# Patient Record
Sex: Male | Born: 1950 | Race: Asian | Hispanic: No | Marital: Married | Smoking: Never smoker
Health system: Southern US, Community
[De-identification: ages and names within clinical notes are randomized; demographics above are authoritative.]

## PROBLEM LIST (undated history)

## (undated) DIAGNOSIS — E119 Type 2 diabetes mellitus without complications: Secondary | ICD-10-CM

## (undated) HISTORY — DX: Type 2 diabetes mellitus without complications: E11.9

## (undated) HISTORY — PX: SPINE SURGERY: SHX786

---

## 2016-01-11 ENCOUNTER — Ambulatory Visit (INDEPENDENT_AMBULATORY_CARE_PROVIDER_SITE_OTHER): Payer: Medicare Other | Admitting: Family Medicine

## 2016-01-11 VITALS — BP 120/82 | HR 75 | Temp 98.0°F | Resp 16 | Ht 68.0 in | Wt 167.8 lb

## 2016-01-11 DIAGNOSIS — H7291 Unspecified perforation of tympanic membrane, right ear: Secondary | ICD-10-CM | POA: Diagnosis not present

## 2016-01-11 NOTE — Patient Instructions (Addendum)
I am arranging an appointment with Dr. Esaw DaceJim Crossley as soon as possible. You will be getting a telephone call when the appointment has been made.    IF you received an x-ray today, you will receive an invoice from [facility]. Please contact [facility] at [phone] with questions or concerns regarding your invoice.   IF you received labwork today, you will receive an invoice from [facility]. Please contact [facility] at [phone] with questions or concerns regarding your invoice.   Our billing staff will not be able to assist you with questions regarding bills from these companies.  You will be contacted with the lab results as soon as they are available. The fastest way to get your results is to activate your My Chart account. Instructions are located on the last page of this paperwork. If you have not heard from us regarding the results in 2 weeks, please contact this office.

## 2016-01-11 NOTE — Progress Notes (Signed)
This is a 65 year old Asian man who was swimming 6 weeks ago in [place] when he was visiting his son. The next day he took an airplane ride and noticed that he had pain and loss of hearing in his right ear. He was seen by another office and amoxicillin was prescribed. He has not had any improvement.  When the patient bears down and lows through his nose, he can hear air whishing through the right ear.  Objective:BP 120/82 mmHg  Pulse 75  Temp(Src) 98 F (36.7 C) (Oral)  Resp 16  Ht 5\' 8"  (1.727 m)  Wt 167 lb 12.8 oz (76.114 kg)  BMI 25.52 kg/m2  SpO2 99% Very pleasant gentleman in no acute distress Audible air with sling through right ear when patient blows to his nose Examination tympanic membranes reveals marked retraction of the left and a perforation of the right eardrum.  Assessment: Persistent perforation.  Plan: ENT evaluation as soon as we can get him in  Signed, Sheila OatsKurt Lisabeth Mian M.D.

## 2016-07-03 ENCOUNTER — Ambulatory Visit (INDEPENDENT_AMBULATORY_CARE_PROVIDER_SITE_OTHER): Payer: Medicare Other

## 2016-07-03 DIAGNOSIS — Z23 Encounter for immunization: Secondary | ICD-10-CM | POA: Diagnosis not present

## 2016-08-22 ENCOUNTER — Ambulatory Visit (INDEPENDENT_AMBULATORY_CARE_PROVIDER_SITE_OTHER): Payer: Medicare HMO | Admitting: Family Medicine

## 2016-08-22 VITALS — BP 150/82 | HR 91 | Temp 98.1°F | Resp 18 | Ht 67.0 in | Wt 171.0 lb

## 2016-08-22 DIAGNOSIS — E785 Hyperlipidemia, unspecified: Secondary | ICD-10-CM | POA: Diagnosis not present

## 2016-08-22 DIAGNOSIS — I10 Essential (primary) hypertension: Secondary | ICD-10-CM

## 2016-08-22 DIAGNOSIS — E119 Type 2 diabetes mellitus without complications: Secondary | ICD-10-CM

## 2016-08-22 MED ORDER — ROSUVASTATIN CALCIUM 40 MG PO TABS: 40.0000 mg | ORAL_TABLET | Freq: Every day | ORAL | 1 refills | Status: DC

## 2016-08-22 MED ORDER — LISINOPRIL-HYDROCHLOROTHIAZIDE 20-12.5 MG PO TABS: 1.0000 | ORAL_TABLET | Freq: Every day | ORAL | 1 refills | Status: DC

## 2016-08-22 MED ORDER — METFORMIN HCL 500 MG PO TABS: 500.0000 mg | ORAL_TABLET | Freq: Two times a day (BID) | ORAL | 1 refills | Status: DC

## 2016-08-22 NOTE — Progress Notes (Signed)
Subjective:  By signing my name below, I, Lee Lawson, attest that this documentation has been prepared under the direction and in the presence of Lee SorensonEva Clifford Coudriet, MD Electronically Signed: Charline BillsEssence Lawson, ED Scribe 08/22/2016 at 12:38 PM.   Patient ID: Lee Lawson Travaglini, male    DOB: June 22, 1951, 66 y.o.   MRN: [id number]  Chief Complaint  Patient presents with  . Medication Refill    Lisinopril/HCTZ; Crestor; Metformin.   HPI Lee Lawson Bott is a 66 y.o. male who presents to [facility] at [facility] for a medication refill of Lisinopril-HCTZ, Crestor and Metformin. Pt is not fasting at this visit. He checks his blood glucose twice weekly in the mornings with readings of 100-105, however this morning his blood glucose was 97. He occasionally checks his BP at home in the mornings with average systolic readings in the 140s. Pt denies nausea, diarrhea, abdominal pain, burning sensation in feet, visual disturbances, rash, wounds. Last eye exam was ~1 year ago. He does not have a local eye doctor; pt recently moved here from [place] after retirement. He has received his flu vaccine this season.   Past Medical History:  Diagnosis Date  . Diabetes mellitus without complication [facility](HCC)    Current Outpatient Prescriptions on File Prior to Visit  Medication Sig Dispense Refill  . lisinopril-hydrochlorothiazide (PRINZIDE,ZESTORETIC) 10-12.5 MG tablet Take 1 tablet by mouth daily.    . metFORMIN (GLUCOPHAGE) 500 MG tablet Take by mouth 2 (two) times daily with a meal.    . omega-3 fish oil (MAXEPA) 1000 MG CAPS capsule Take by mouth.    . rosuvastatin (CRESTOR) 40 MG tablet Take 40 mg by mouth daily.     No current facility-administered medications on file prior to visit.    No Known Allergies   Review of Systems  Constitutional: Negative for activity change, appetite change, chills, fever and unexpected weight change.  Eyes: Negative for visual disturbance.  Respiratory: Negative for shortness of  breath.   Cardiovascular: Negative for chest pain and leg swelling.  Gastrointestinal: Negative for abdominal pain, diarrhea and nausea.  Skin: Negative for rash and wound.  Allergic/Immunologic: Negative for immunocompromised state.  Neurological: Negative for weakness and numbness.      Objective:   Physical Exam  Constitutional: He is oriented to person, place, and time. He appears well-developed and well-nourished. No distress.  HENT:  Head: Normocephalic and atraumatic.  Eyes: Conjunctivae and EOM are normal.  Neck: Neck supple. No tracheal deviation present. No thyromegaly present.  Cardiovascular: Normal rate, regular rhythm, S1 normal, S2 normal and normal heart sounds.   Pulses:      Dorsalis pedis pulses are 2+ on the right side, and 2+ on the left side.  Pulmonary/Chest: Effort normal and breath sounds normal. No respiratory distress.  Musculoskeletal: Normal range of motion.  Neurological: He is alert and oriented to person, place, and time.  Skin: Skin is warm and dry.  Psychiatric: He has a normal mood and affect. His behavior is normal.  Nursing note and vitals reviewed.  BP (!) 150/82 (BP Location: Right Arm, Patient Position: Sitting, Cuff Size: Normal)   Pulse 91   Temp 98.1 F (36.7 C) (Oral)   Resp 18   Ht 5\' 7"  (1.702 m)   Wt 171 lb (77.6 kg)   SpO2 98%   BMI 26.78 kg/m     Diabetic Foot Exam - Simple   Simple Foot Form Diabetic Foot exam was performed with the following findings:  Yes 08/22/2016 12:48 PM  Visual Inspection No deformities, no ulcerations, no other skin breakdown bilaterally:  Yes Sensation Testing Intact to touch and monofilament testing bilaterally:  Yes Pulse Check Posterior Tibialis and Dorsalis pulse intact bilaterally:  Yes Comments     Assessment & Plan:   1. Type 2 diabetes mellitus without complication, without long-term current use of insulin (HCC)   2. Hyperlipidemia LDL goal <100   3. Essential hypertension      Orders Placed This Encounter  Procedures  . Comprehensive metabolic panel  . Microalbumin/Creatinine Ratio, Urine  . Hemoglobin A1c  . Ambulatory referral to Ophthalmology    Referral Priority:   Routine    Referral Type:   Consultation    Referral Reason:   Specialty Services Required    Requested Specialty:   Ophthalmology    Number of Visits Requested:   1  . Care order/instruction:    Scheduling Instructions:     Complete orders, AVS and go.  Marland Kitchen HM DIABETES FOOT EXAM    Meds ordered this encounter  Medications  . rosuvastatin (CRESTOR) 40 MG tablet    Sig: Take 1 tablet (40 mg total) by mouth daily.    Dispense:  90 tablet    Refill:  1  . metFORMIN (GLUCOPHAGE) 500 MG tablet    Sig: Take 1 tablet (500 mg total) by mouth 2 (two) times daily with a meal.    Dispense:  180 tablet    Refill:  1  . lisinopril-hydrochlorothiazide (PRINZIDE,ZESTORETIC) 20-12.5 MG tablet    Sig: Take 1 tablet by mouth daily.    Dispense:  90 tablet    Refill:  1    I personally performed the services described in this documentation, which was scribed in my presence. The recorded information has been reviewed and considered, and addended by me as needed.   Lee Sorenson, M.D.  Primary Care at [facility] [address] [place], [place] [address] [phone] phone [phone] fax  09/04/16 11:01 PM  Results for orders placed or performed in visit on 08/22/16  Comprehensive metabolic panel  Result Value Ref Range   Glucose 99 65 - 99 mg/dL   BUN 15 8 - 27 mg/dL   Creatinine, Ser 1.30 0.76 - 1.27 mg/dL   GFR calc non Af Amer 90 >59 mL/min/1.73   GFR calc Af Amer 104 >59 mL/min/1.73   BUN/Creatinine Ratio 17 10 - 24   Sodium 139 134 - 144 mmol/L   Potassium 4.7 3.5 - 5.2 mmol/L   Chloride 96 96 - 106 mmol/L   CO2 27 18 - 29 mmol/L   Calcium 9.9 8.6 - 10.2 mg/dL   Total Protein 8.1 6.0 - 8.5 g/dL   Albumin 4.7 3.6 - 4.8 g/dL   Globulin, Total 3.4 1.5 - 4.5 g/dL    Albumin/Globulin Ratio 1.4 1.2 - 2.2   Bilirubin Total 0.5 0.0 - 1.2 mg/dL   Alkaline Phosphatase 71 39 - 117 IU/L   AST 25 0 - 40 IU/L   ALT 11 0 - 44 IU/L  Microalbumin/Creatinine Ratio, Urine  Result Value Ref Range   Creatinine, Urine 52.7 Not Estab. mg/dL   Albumin, Urine 86.5 Not Estab. ug/mL   Microalb/Creat Ratio 29.2 0.0 - 30.0 mg/g creat  Hemoglobin A1c  Result Value Ref Range   Hgb A1c MFr Bld 6.4 (H) 4.8 - 5.6 %   Est. average glucose Bld gHb Est-mCnc 137 mg/dL

## 2016-08-22 NOTE — Patient Instructions (Addendum)
   IF you received an x-ray today, you will receive an invoice from [facility]. Please contact [facility] at [phone] with questions or concerns regarding your invoice.   IF you received labwork today, you will receive an invoice from [facility]. Please contact [facility] at [phone] with questions or concerns regarding your invoice.   Our billing staff will not be able to assist you with questions regarding bills from these companies.  You will be contacted with the lab results as soon as they are available. The fastest way to get your results is to activate your My Chart account. Instructions are located on the last page of this paperwork. If you have not heard from us regarding the results in 2 weeks, please contact this office.      Managing Your Hypertension Hypertension is commonly called high blood pressure. Blood pressure is a measurement of how strongly your blood is pressing against the walls of your arteries. Arteries are blood vessels that carry blood from your heart throughout your body. Blood pressure does not stay the same. It rises when you are active, excited, or nervous. It lowers when you are sleeping or relaxed. If the numbers that measure your blood pressure stay above normal most of the time, you are at risk for health problems. Hypertension is a long-term (chronic) condition in which blood pressure is elevated. This condition often has no signs or symptoms. The cause of the condition is usually not known. What are blood pressure readings? A blood pressure reading is recorded as two numbers, such as "120 over 80" (or 120/80). The first ("top") number is called the systolic pressure. It is a measure of the pressure in your arteries as the heart beats. The second ("bottom") number is called the diastolic pressure. It is a measure of the pressure in your arteries as the heart relaxes between beats. What does my blood pressure reading mean? Blood  pressure is classified into four stages. Based on your blood pressure reading, your health care provider may use the following stages to determine what type of treatment, if any, is needed. Systolic pressure and diastolic pressure are measured in a unit called mm Hg. Normal  Systolic pressure: below 120.  Diastolic pressure: below 80. Prehypertension  Systolic pressure: 120-139.  Diastolic pressure: 80-89. Hypertension stage 1  Systolic pressure: 140-159.  Diastolic pressure: 90-99. Hypertension stage 2  Systolic pressure: 160 or above.  Diastolic pressure: 100 or above. What health risks are associated with hypertension? Managing your hypertension is an important responsibility. Uncontrolled hypertension can lead to:  A heart attack.  A stroke.  A weakened blood vessel (aneurysm).  Heart failure.  Kidney damage.  Eye damage.  Metabolic syndrome.  Memory and concentration problems. What changes can I make to manage my hypertension? Hypertension can be managed effectively by making lifestyle changes and possibly by taking medicines. Your health care provider will help you come up with a plan to bring your blood pressure within a normal range. Your plan should include the following: Monitoring  Monitor your blood pressure at home as told by your health care provider. Your personal target blood pressure may vary depending on your medical conditions, your age, and other factors.  Have your blood pressure rechecked as told by your health care provider. Lifestyle  Lose weight if necessary.  Get at least 30-45 minutes of aerobic exercise at least 4 times a week.  Do not use any products that contain nicotine or tobacco, such as cigarettes and   e-cigarettes. If you need help quitting, ask your health care provider.  Learn ways to reduce stress.  Control any chronic conditions, such as high cholesterol or diabetes. Eating and drinking  Follow the DASH diet. This diet  is high in fruits, vegetables, and whole grains. It is low in salt, red meat, and added sugars.  Keep your sodium intake below 2,300 mg per day.  Limit alcoholic beverages. Communication  Review all the medicines you take with your health care provider because there may be side effects or interactions.  Talk with your health care provider about your diet, exercise habits, and other lifestyle factors that may be contributing to hypertension.  See your health care provider regularly. Your health care provider can help you create and adjust your plan for managing hypertension. Will I need medicine to control my blood pressure? Your health care provider may prescribe medicine if lifestyle changes are not enough to get your blood pressure under control, and if one of the following is true:  You are 18-59 years of age, and your systolic blood pressure is 140 or higher.  You are 60 years of age or older, and your systolic blood pressure is 150 or higher.  Your diastolic blood pressure is 90 or higher.  You have diabetes, and your systolic blood pressure is over 140 or your diastolic blood pressure is over 90.  You have kidney disease, and your blood pressure is above 140/90.  You have heart disease or a history of stroke, and your blood pressure is 140/90 or higher. Take medicines only as told by your health care provider. Follow the directions carefully. Blood pressure medicines must be taken as prescribed. The medicine does not work as well when you skip doses. Skipping doses also puts you at risk for problems. Contact a health care provider if:  You think you are having a reaction to medicines you have taken.  You have repeated (recurrent) headaches.  You feel dizzy.  You have swelling in your ankles.  You have trouble with your vision. Get help right away if:  You develop a severe headache or confusion.  You have unusual weakness or numbness, or you feel faint.  You have  severe pain in your chest or abdomen.  You vomit repeatedly.  You have trouble breathing. This information is not intended to replace advice given to you by your health care provider. Make sure you discuss any questions you have with your health care provider. Document Released: 04/03/2012 Document Revised: 03/14/2016 Document Reviewed: 10/08/2015 Elsevier Interactive Patient Education  2017 Elsevier Inc.  

## 2016-08-23 LAB — COMPREHENSIVE METABOLIC PANEL
ALK PHOS: 71 IU/L (ref 39–117)
ALT: 11 IU/L (ref 0–44)
AST: 25 IU/L (ref 0–40)
Albumin/Globulin Ratio: 1.4 (ref 1.2–2.2)
Albumin: 4.7 g/dL (ref 3.6–4.8)
BUN/Creatinine Ratio: 17 (ref 10–24)
BUN: 15 mg/dL (ref 8–27)
Bilirubin Total: 0.5 mg/dL (ref 0.0–1.2)
CHLORIDE: 96 mmol/L (ref 96–106)
CO2: 27 mmol/L (ref 18–29)
CREATININE: 0.87 mg/dL (ref 0.76–1.27)
Calcium: 9.9 mg/dL (ref 8.6–10.2)
GFR calc Af Amer: 104 mL/min/{1.73_m2} (ref >59)
GFR calc non Af Amer: 90 mL/min/{1.73_m2} (ref >59)
GLUCOSE: 99 mg/dL (ref 65–99)
Globulin, Total: 3.4 g/dL (ref 1.5–4.5)
Potassium: 4.7 mmol/L (ref 3.5–5.2)
Sodium: 139 mmol/L (ref 134–144)
Total Protein: 8.1 g/dL (ref 6.0–8.5)

## 2016-08-23 LAB — MICROALBUMIN / CREATININE URINE RATIO
CREATININE, UR: 52.7 mg/dL
MICROALB/CREAT RATIO: 29.2 mg/g{creat} (ref 0.0–30.0)
MICROALBUM., U, RANDOM: 15.4 ug/mL

## 2016-08-23 LAB — HEMOGLOBIN A1C
Est. average glucose Bld gHb Est-mCnc: 137 mg/dL
Hgb A1c MFr Bld: 6.4 % — ABNORMAL HIGH (ref 4.8–5.6)

## 2016-08-28 ENCOUNTER — Encounter: Payer: Self-pay | Admitting: *Deleted

## 2016-09-13 DIAGNOSIS — H9071 Mixed conductive and sensorineural hearing loss, unilateral, right ear, with unrestricted hearing on the contralateral side: Secondary | ICD-10-CM | POA: Diagnosis not present

## 2016-09-13 DIAGNOSIS — H7201 Central perforation of tympanic membrane, right ear: Secondary | ICD-10-CM | POA: Diagnosis not present

## 2016-09-26 ENCOUNTER — Other Ambulatory Visit: Payer: Self-pay | Admitting: Family Medicine

## 2016-10-11 DIAGNOSIS — H7201 Central perforation of tympanic membrane, right ear: Secondary | ICD-10-CM | POA: Diagnosis not present

## 2016-11-22 DIAGNOSIS — I10 Essential (primary) hypertension: Secondary | ICD-10-CM | POA: Diagnosis not present

## 2016-11-22 DIAGNOSIS — Z7984 Long term (current) use of oral hypoglycemic drugs: Secondary | ICD-10-CM | POA: Diagnosis not present

## 2016-11-22 DIAGNOSIS — E7800 Pure hypercholesterolemia, unspecified: Secondary | ICD-10-CM | POA: Diagnosis not present

## 2016-11-22 DIAGNOSIS — Z79899 Other long term (current) drug therapy: Secondary | ICD-10-CM | POA: Diagnosis not present

## 2016-11-22 DIAGNOSIS — Z6828 Body mass index (BMI) 28.0-28.9, adult: Secondary | ICD-10-CM | POA: Diagnosis not present

## 2016-11-22 DIAGNOSIS — E119 Type 2 diabetes mellitus without complications: Secondary | ICD-10-CM | POA: Diagnosis not present

## 2016-11-22 DIAGNOSIS — Z0000 Encounter for general adult medical examination without abnormal findings: Secondary | ICD-10-CM | POA: Diagnosis not present

## 2016-12-20 NOTE — Progress Notes (Addendum)
Subjective:    Patient ID: Lee Lawson, male    DOB: 15-Nov-1950, 66 y.o.   MRN: [id number] Chief Complaint  Patient presents with  . Diabetes    4 month follow up  . Follow-up    HPI  Lee Lawson is a delightful 66 yo who recently relocated here from [place] after retirement. He is here for a 4 mo follow-up on his chronic medical conditions including diabetes.  DMII: On metformin 500 bid. checks his blood glucose twice weekly in the mornings with readings of 100-105. Foot exam done 08/22/16. Referred to optho but pt declined stating he didn't need a DM eye exam.  Urine microalb 07/3016 was normal. eGFR 90.  Asa? Lab Results  Component Value Date   HGBA1C 6.4 (H) 08/22/2016   HTN: At last visit pt reported BP at home checked occ with average systolic readings in the 124P and was elev in the office so lisinopril-hctz 10-12.5 was increased to 20-12.5.  Since the, BP at home has been  HLD: On Crestor 40 and otc fish oil. No prior lipid panel avail to me today. He is not fasting today.  Past Medical History:  Diagnosis Date  . Diabetes mellitus without complication [facility])    Past Surgical History:  Procedure Laterality Date  . SPINE SURGERY     Current Outpatient Prescriptions on File Prior to Visit  Medication Sig Dispense Refill  . metFORMIN (GLUCOPHAGE) 500 MG tablet Take 1 tablet (500 mg total) by mouth 2 (two) times daily with a meal. 180 tablet 1  . omega-3 fish oil (MAXEPA) 1000 MG CAPS capsule Take by mouth.    . rosuvastatin (CRESTOR) 40 MG tablet Take 1 tablet (40 mg total) by mouth daily. 90 tablet 1   No current facility-administered medications on file prior to visit.    No Known Allergies History reviewed. No pertinent family history. Social History   Social History  . Marital status: Married    Spouse name: N/A  . Number of children: N/A  . Years of education: N/A   Social History Main Topics  . Smoking status: Never Smoker  . Smokeless tobacco:  Never Used  . Alcohol use 0.0 oz/week  . Drug use: No  . Sexual activity: Not Asked   Other Topics Concern  . None   Social History Narrative  . None   Depression screen [facility] 2/9 12/21/2016 08/22/2016 01/11/2016  Decreased Interest 0 0 0  Down, Depressed, Hopeless 0 0 0  PHQ - 2 Score 0 0 0     Review of Systems See hpi    Objective:   Physical Exam  Constitutional: He is oriented to person, place, and time. He appears well-developed and well-nourished. No distress.  HENT:  Head: Normocephalic and atraumatic.  Eyes: Conjunctivae are normal. Pupils are equal, round, and reactive to light. No scleral icterus.  Neck: Normal range of motion. Neck supple. No thyromegaly present.  Cardiovascular: Normal rate, regular rhythm, normal heart sounds and intact distal pulses.   Pulmonary/Chest: Effort normal and breath sounds normal. No respiratory distress.  Musculoskeletal: He exhibits no edema.  Lymphadenopathy:    He has no cervical adenopathy.  Neurological: He is alert and oriented to person, place, and time.  Skin: Skin is warm and dry. He is not diaphoretic.  Psychiatric: He has a normal mood and affect. His behavior is normal.    BP (!) 144/92   Pulse 80   Temp 97.7 F (36.5 C) (Oral)  Resp 18   Ht _0  (1.702 m)   Wt 168 lb 12.8 oz (76.6 kg)   SpO2 98%   BMI 26.44 kg/m   [facility] reading (PRIMARY) by  Dr. Brigitte Pulse. EKG: NSR, no ischemic change     Assessment & Plan:   Have pt sign release to get copy of prior records. - last AWV, labs for 3 years. Pneumovax? Prevnar? - need to get old records. Assume he has likely had pneumovax but not prevnar - pt concurs Sched AWV 4- 6 mos  1. Type 2 diabetes mellitus without complication, without long-term current use of insulin ([place])   2. Essential hypertension - double up on lisinopril-hctz 20-12.5 to 40-25  - check bp at home and let me know results, check at nurse only visit in 10d.  3. Hyperlipidemia LDL goal <100   4.  Medication monitoring encounter   5. BMI 26.0-26.9,adult    Recheck in 10d for fasting labs and drop off copy of home BPs for my review at htat time- ok to do nurse only visit for this if pt prefers.  Orders Placed This Encounter  Procedures  . Pneumococcal conjugate vaccine 13-valent IM  . Care order/instruction:    Scheduling Instructions:     Complete orders, AVS and go.  . EKG 12-Lead    Meds ordered this encounter  Medications  . DISCONTD: lisinopril-hydrochlorothiazide (PRINZIDE,ZESTORETIC) 20-25 MG tablet    Sig: Take 1 tablet by mouth daily.    Dispense:  90 tablet    Refill:  1  . lisinopril-hydrochlorothiazide (PRINZIDE,ZESTORETIC) 20-12.5 MG tablet    Sig: Take 2 tablets by mouth daily.    Dispense:  90 tablet    Refill:  0    Delman Cheadle, M.D.  Primary Care at [facility] [address] [place], [place] [address] [phone] phone [phone] fax  12/23/16 10:16 PM

## 2016-12-21 ENCOUNTER — Encounter: Payer: Self-pay | Admitting: Family Medicine

## 2016-12-21 ENCOUNTER — Telehealth: Payer: Self-pay

## 2016-12-21 ENCOUNTER — Ambulatory Visit (INDEPENDENT_AMBULATORY_CARE_PROVIDER_SITE_OTHER): Payer: Medicare HMO | Admitting: Family Medicine

## 2016-12-21 VITALS — BP 144/92 | HR 80 | Temp 97.7°F | Resp 18 | Ht 67.0 in | Wt 168.8 lb

## 2016-12-21 DIAGNOSIS — E785 Hyperlipidemia, unspecified: Secondary | ICD-10-CM

## 2016-12-21 DIAGNOSIS — Z5181 Encounter for therapeutic drug level monitoring: Secondary | ICD-10-CM | POA: Diagnosis not present

## 2016-12-21 DIAGNOSIS — Z23 Encounter for immunization: Secondary | ICD-10-CM

## 2016-12-21 DIAGNOSIS — Z6826 Body mass index (BMI) 26.0-26.9, adult: Secondary | ICD-10-CM | POA: Diagnosis not present

## 2016-12-21 DIAGNOSIS — I10 Essential (primary) hypertension: Secondary | ICD-10-CM

## 2016-12-21 DIAGNOSIS — E119 Type 2 diabetes mellitus without complications: Secondary | ICD-10-CM | POA: Diagnosis not present

## 2016-12-21 MED ORDER — LISINOPRIL-HYDROCHLOROTHIAZIDE 20-25 MG PO TABS: 1.0000 | ORAL_TABLET | Freq: Every day | ORAL | 1 refills | Status: DC

## 2016-12-21 MED ORDER — LISINOPRIL-HYDROCHLOROTHIAZIDE 20-12.5 MG PO TABS: 2.0000 | ORAL_TABLET | Freq: Every day | ORAL | 0 refills | Status: DC

## 2016-12-21 NOTE — Telephone Encounter (Signed)
Spoke with pharmacy staff and prescription for Lisinopril-HCTZ was called in and cancelled.

## 2016-12-21 NOTE — Patient Instructions (Addendum)
   IF you received an x-ray today, you will receive an invoice from [facility]. Please contact [facility] at [phone] with questions or concerns regarding your invoice.   IF you received labwork today, you will receive an invoice from [facility]. Please contact [facility] at [phone] with questions or concerns regarding your invoice.   Our billing staff will not be able to assist you with questions regarding bills from these companies.  You will be contacted with the lab results as soon as they are available. The fastest way to get your results is to activate your My Chart account. Instructions are located on the last page of this paperwork. If you have not heard from us regarding the results in 2 weeks, please contact this office.      Managing Your Hypertension Hypertension is commonly called high blood pressure. This is when the force of your blood pressing against the walls of your arteries is too strong. Arteries are blood vessels that carry blood from your heart throughout your body. Hypertension forces the heart to work harder to pump blood, and may cause the arteries to become narrow or stiff. Having untreated or uncontrolled hypertension can cause heart attack, stroke, kidney disease, and other problems. What are blood pressure readings? A blood pressure reading consists of a higher number over a lower number. Ideally, your blood pressure should be below 120/80. The first ("top") number is called the systolic pressure. It is a measure of the pressure in your arteries as your heart beats. The second ("bottom") number is called the diastolic pressure. It is a measure of the pressure in your arteries as the heart relaxes. What does my blood pressure reading mean? Blood pressure is classified into four stages. Based on your blood pressure reading, your health care provider may use the following stages to determine what type of treatment you need, if any. Systolic  pressure and diastolic pressure are measured in a unit called mm Hg. Normal  Systolic pressure: below 120.  Diastolic pressure: below 80. Elevated  Systolic pressure: 120-129.  Diastolic pressure: below 80. Hypertension stage 1  Systolic pressure: 130-139.  Diastolic pressure: 80-89. Hypertension stage 2  Systolic pressure: 140 or above.  Diastolic pressure: 90 or above. What health risks are associated with hypertension? Managing your hypertension is an important responsibility. Uncontrolled hypertension can lead to:  A heart attack.  A stroke.  A weakened blood vessel (aneurysm).  Heart failure.  Kidney damage.  Eye damage.  Metabolic syndrome.  Memory and concentration problems.  What changes can I make to manage my hypertension? Hypertension can be managed by making lifestyle changes and possibly by taking medicines. Your health care provider will help you make a plan to bring your blood pressure within a normal range. Eating and drinking  Eat a diet that is high in fiber and potassium, and low in salt (sodium), added sugar, and fat. An example eating plan is called the DASH (Dietary Approaches to Stop Hypertension) diet. To eat this way: ? Eat plenty of fresh fruits and vegetables. Try to fill half of your plate at each meal with fruits and vegetables. ? Eat whole grains, such as whole wheat pasta, brown rice, or whole grain bread. Fill about one quarter of your plate with whole grains. ? Eat low-fat diary products. ? Avoid fatty cuts of meat, processed or cured meats, and poultry with skin. Fill about one quarter of your plate with lean proteins such as fish, chicken without skin, beans, eggs,   and tofu. ? Avoid premade and processed foods. These tend to be higher in sodium, added sugar, and fat.  Reduce your daily sodium intake. Most people with hypertension should eat less than 1,500 mg of sodium a day.  Limit alcohol intake to no more than 1 drink a day  for nonpregnant women and 2 drinks a day for men. One drink equals 12 oz of beer, 5 oz of wine, or 1 oz of hard liquor. Lifestyle  Work with your health care provider to maintain a healthy body weight, or to lose weight. Ask what an ideal weight is for you.  Get at least 30 minutes of exercise that causes your heart to beat faster (aerobic exercise) most days of the week. Activities may include walking, swimming, or biking.  Include exercise to strengthen your muscles (resistance exercise), such as weight lifting, as part of your weekly exercise routine. Try to do these types of exercises for 30 minutes at least 3 days a week.  Do not use any products that contain nicotine or tobacco, such as cigarettes and e-cigarettes. If you need help quitting, ask your health care provider.  Control any long-term (chronic) conditions you have, such as high cholesterol or diabetes. Monitoring  Monitor your blood pressure at home as told by your health care provider. Your personal target blood pressure may vary depending on your medical conditions, your age, and other factors.  Have your blood pressure checked regularly, as often as told by your health care provider. Working with your health care provider  Review all the medicines you take with your health care provider because there may be side effects or interactions.  Talk with your health care provider about your diet, exercise habits, and other lifestyle factors that may be contributing to hypertension.  Visit your health care provider regularly. Your health care provider can help you create and adjust your plan for managing hypertension. Will I need medicine to control my blood pressure? Your health care provider may prescribe medicine if lifestyle changes are not enough to get your blood pressure under control, and if:  Your systolic blood pressure is 130 or higher.  Your diastolic blood pressure is 80 or higher.  Take medicines only as told  by your health care provider. Follow the directions carefully. Blood pressure medicines must be taken as prescribed. The medicine does not work as well when you skip doses. Skipping doses also puts you at risk for problems. Contact a health care provider if:  You think you are having a reaction to medicines you have taken.  You have repeated (recurrent) headaches.  You feel dizzy.  You have swelling in your ankles.  You have trouble with your vision. Get help right away if:  You develop a severe headache or confusion.  You have unusual weakness or numbness, or you feel faint.  You have severe pain in your chest or abdomen.  You vomit repeatedly.  You have trouble breathing. Summary  Hypertension is when the force of blood pumping through your arteries is too strong. If this condition is not controlled, it may put you at risk for serious complications.  Your personal target blood pressure may vary depending on your medical conditions, your age, and other factors. For most people, a normal blood pressure is less than 120/80.  Hypertension is managed by lifestyle changes, medicines, or both. Lifestyle changes include weight loss, eating a healthy, low-sodium diet, exercising more, and limiting alcohol. This information is not intended to replace advice   given to you by your health care provider. Make sure you discuss any questions you have with your health care provider. Document Released: 04/03/2012 Document Revised: 06/07/2016 Document Reviewed: 06/07/2016 Elsevier Interactive Patient Education  2018 [employer]vier Inc.  

## 2016-12-28 ENCOUNTER — Telehealth: Payer: Self-pay | Admitting: Physician Assistant

## 2016-12-28 NOTE — Telephone Encounter (Signed)
Pt insurance [employer] is calling to get a glucometer  One touch ultra  With 100 test strips and lancets  90day supply  Best number [phone] Clide CliffMary Holub RN [employer]

## 2016-12-29 MED ORDER — BLOOD GLUCOSE MONITOR KIT: PACK | 0 refills | Status: AC

## 2016-12-29 NOTE — Telephone Encounter (Signed)
Faxed to [facility]

## 2017-01-01 ENCOUNTER — Telehealth: Payer: Self-pay

## 2017-01-01 NOTE — Telephone Encounter (Signed)
PA Started Rosuvastatin Awaiting Response Follow up code:  [id number]

## 2017-01-01 NOTE — Telephone Encounter (Signed)
PA Started Metformin 500mg  Awaiting response Follow up Code: [id number]

## 2017-01-01 NOTE — Telephone Encounter (Signed)
Received Fax for PA on LISINOPRIL HXTZ 20/12.5 Prescription was cancelled on 12/21/2016  Discarded Fax

## 2017-01-06 ENCOUNTER — Other Ambulatory Visit: Payer: Medicare HMO | Admitting: Family Medicine

## 2017-01-06 DIAGNOSIS — E785 Hyperlipidemia, unspecified: Secondary | ICD-10-CM

## 2017-01-06 DIAGNOSIS — Z5181 Encounter for therapeutic drug level monitoring: Secondary | ICD-10-CM

## 2017-01-06 DIAGNOSIS — R69 Illness, unspecified: Secondary | ICD-10-CM | POA: Diagnosis not present

## 2017-01-06 DIAGNOSIS — I10 Essential (primary) hypertension: Secondary | ICD-10-CM

## 2017-01-06 DIAGNOSIS — E119 Type 2 diabetes mellitus without complications: Secondary | ICD-10-CM | POA: Diagnosis not present

## 2017-01-07 LAB — COMPREHENSIVE METABOLIC PANEL
A/G RATIO: 1.4 (ref 1.2–2.2)
ALT: 18 IU/L (ref 0–44)
AST: 39 IU/L (ref 0–40)
Albumin: 4.7 g/dL (ref 3.6–4.8)
Alkaline Phosphatase: 75 IU/L (ref 39–117)
BUN/Creatinine Ratio: 20 (ref 10–24)
BUN: 23 mg/dL (ref 8–27)
Bilirubin Total: 0.6 mg/dL (ref 0.0–1.2)
CHLORIDE: 96 mmol/L (ref 96–106)
CO2: 25 mmol/L (ref 20–29)
Calcium: 10.3 mg/dL — ABNORMAL HIGH (ref 8.6–10.2)
Creatinine, Ser: 1.16 mg/dL (ref 0.76–1.27)
GFR calc Af Amer: 75 mL/min/{1.73_m2} (ref >59)
GFR calc non Af Amer: 65 mL/min/{1.73_m2} (ref >59)
GLUCOSE: 107 mg/dL — AB (ref 65–99)
Globulin, Total: 3.3 g/dL (ref 1.5–4.5)
POTASSIUM: 5 mmol/L (ref 3.5–5.2)
Sodium: 136 mmol/L (ref 134–144)
TOTAL PROTEIN: 8 g/dL (ref 6.0–8.5)

## 2017-01-07 LAB — HEMOGLOBIN A1C
ESTIMATED AVERAGE GLUCOSE: 128 mg/dL
HEMOGLOBIN A1C: 6.1 % — AB (ref 4.8–5.6)

## 2017-01-07 LAB — LIPID PANEL
Chol/HDL Ratio: 3.5 ratio (ref 0.0–5.0)
Cholesterol, Total: 154 mg/dL (ref 100–199)
HDL: 44 mg/dL (ref >39)
LDL CALC: 45 mg/dL (ref 0–99)
Triglycerides: 326 mg/dL — ABNORMAL HIGH (ref 0–149)
VLDL Cholesterol Cal: 65 mg/dL — ABNORMAL HIGH (ref 5–40)

## 2017-01-08 NOTE — Progress Notes (Signed)
Lab only. Dropped off bp log.

## 2017-01-08 NOTE — Telephone Encounter (Signed)
Called to check on PA status of Crestor .Insurance states it did not need a PA and had a paid claim on 01/01/17. Verified with pharmacy. [facility]. was filled and picked up by pt.

## 2017-01-08 NOTE — Telephone Encounter (Signed)
Called to check on PA Status of Metformin. Insurance stated it did not need a PA after all and had a paid claim status as of 01/01/2017. Verified with Pharmacy.  Prescription was filled 6/11

## 2017-04-04 ENCOUNTER — Other Ambulatory Visit: Payer: Self-pay | Admitting: Family Medicine

## 2017-04-04 NOTE — Telephone Encounter (Signed)
Do you want to refill this since patient is not taking medications ?

## 2017-04-22 NOTE — Progress Notes (Signed)
Subjective:    Patient ID: Lee Lawson, male    DOB: 1950/12/28, 66 y.o.   MRN: [id number] Chief Complaint  Patient presents with  . Annual Exam  . Tailbone Pain    flare up been having since 2012 after neck surgery.   . Other    Handicap place card    HPI  Lee Lawson is a delightful 66 yo who recently relocated here from [place] after retirement.  He is here for a complete physical - has not had one prior in our system.  I last saw him 4 mos prior for a review of his chronic medical conditions.  Primary Preventative Screenings: Prostate Cancer:  STI screening: hep C - agrees to have this screening test done Colorectal Cancer: pt refuses both cologuard and colonoscopy - has not had any prior.  Tobacco use/AAA/Lung/EtOH/Illicit substances: never smoked at all - denies >100 cig/d. Soc use of EtOH Cardiac: EKG done 12/21/16 Weight/Blood sugar/Diet/Exercise: going to [employer]; tries to watch diet - doesn't eat to much, occ buffet and fried foods but tries to avoid oily foods OTC/Vit/Supp/Herbal: fish oil & mvi; not taking asa. Dentist/Optho: saw ophtho in 2015, denies any sxs. No dentist due to insurance Immunizations: states he got the tetanus shot several years ago. Immunization History  Administered Date(s) Administered  . Influenza,inj,Quad PF,6+ Mos 07/03/2016  . Pneumococcal Conjugate-13 12/21/2016     Chronic Medical Conditions: DMII: Diagnosed .   Lab Results  Component Value Date   HGBA1C 6.1 (H) 01/06/2017   HGBA1C 6.4 (H) 08/22/2016   CBGs: fasting a.m. 110-120 (checking 1-3x/wk); not checking cbgs after meal  ; No hypoglycemic episodes.  Meter type: One Touch meter but doesn't seem to be working so currently using his old Statistician.  Diet:  Exercising:  DM Med Regimen: metformin 500 bid. Prior changes:   eGFR: 65-90 Baseline Cr: 0.87-1.16  Last checked 01/06/2017. Microalb: 08/22/2016. Normal. On acei Lipids:  LDL ,  non-HDL .  Last  levels done  - were improving from prior. On statin. Pt is NOT fasting today - had milk and cereal. Not taking asa 81 qd.  Optho:  last exam ? Pt declined referral prior as thinks last was done in [place] early 2018? Do have ophtho report from [facility] in [place], [place] done 11/28/2013 Feet: Monofilament exam 08/22/2016 . Denies any no problems.  Not seen by podiatry prior.  Immunizations:  Influenza: done annually 07/03/16 - and done again today  Pneumovax-23: likely had done in [place] at 66 yo. Did have prevnar-13 4 mos prior  HTN: On lisinopril-hctz 20 -12.5 (Increased from 10-12.5 08/22/16 as home SBP 140s). BP at home with average systolic readings [id number] systolic  HLD: On Crestor 40 and otc fish oil. 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 Since then he has had progressively worsening low back pain that radiates down right leg - occ down left leg. He would like to do more exercises to help improve this but doesn't know what to do. Would like to avoid surgery if at all possible. Did have some spinal injections in 1980s.   He currently tries to be active with walking and some gym aerobic activity but it is always  nagging at him and sometimes has to rest before he goes places. No numbness - very rare tingling which resolved with rest. No weakness, no bowel/bladder changes. He has been told not to lift > 10lbs to prevent it worsening.  And he was given a handicapped placard which he would like renewed today.  He did do PT years go. He does not feel that his sxs have worsened over the past several years. He does participate in [employer] of [employer] at [employer].    Review of Systems     Objective:   Physical  Exam       BP (!) 150/90   Pulse 89   Temp 98.4 F (36.9 C) (Oral)   Resp 18   Ht 5' 7.25" (1.708 m)   Wt 177 lb 3.2 oz (80.4 kg)   SpO2 98%   BMI 27.55 kg/m   No exam data present  Results for orders placed or performed in visit on 04/23/17  POCT urinalysis dipstick  Result Value Ref Range   Color, UA yellow yellow   Clarity, UA clear clear   Glucose, UA negative negative mg/dL   Bilirubin, UA negative negative   Ketones, POC UA negative negative mg/dL   Spec Grav, UA 1.015 1.010 - 1.025   Blood, UA trace-intact (A) negative   pH, UA 7.0 5.0 - 8.0   Protein Ur, POC negative negative mg/dL   Urobilinogen, UA 0.2 0.2 or 1.0 E.U./dL   Nitrite, UA Negative Negative   Leukocytes, UA Negative Negative  POCT glycosylated hemoglobin (Hb A1C)  Result Value Ref Range   Hemoglobin A1C 6.5     Assessment & Plan:  Sched AWV w/ [facility] Lee Lawson. Colonoscopy? Flu shot Refer to optho - discuss at next [place] if any injury/bite Shingles vaccine? F/u in 4-6 mos Ua, psa, hep C, cbc, tsh, Cmp, a1c, lipid? - had very high triglycerides 3 mos prior but is not fasting today. Rec to double his fish oil Send freestyle strips to pharmacy - concern that the won't be covered as changed to once touch but doeswn't know how to work it. 1. Annual physical exam   2. Routine screening for STI (sexually transmitted infection)   3. Screening for cardiovascular, respiratory, and genitourinary diseases   4. Screening for colorectal cancer   5. Screening for prostate cancer   6. Type 2 diabetes mellitus without complication, without long-term current use of insulin (HCC)   7. Essential hypertension   8. Hyperlipidemia LDL goal <100   9. BMI 26.0-26.9,adult   10. Medication monitoring encounter   11. Need for prophylactic vaccination and inoculation against influenza     Orders Placed This Encounter  Procedures  . Flu Vaccine QUAD 36+ mos IM  . TSH  . CBC with Differential/Platelet  .  Comprehensive metabolic panel    Order Specific Question:   Has the patient fasted?    Answer:   Yes  . HCV Ab w/Rflx to Verification  . PSA  . Interpretation:  . Care order/instruction:    Scheduling Instructions:     Recheck BP AND EYE EXAM please  . POCT urinalysis dipstick  . POCT glycosylated hemoglobin (Hb A1C)    Meds ordered this encounter  Medications  . lisinopril-hydrochlorothiazide (PRINZIDE,ZESTORETIC) 20-25 MG tablet    Sig: Take 1 tablet by mouth daily.    Dispense:  90 tablet    Refill:  1     Delman Cheadle, M.D.  Primary Care at [facility]  [address], [place] [address] [phone] phone [phone] fax  04/25/17 10:31 PM

## 2017-04-23 ENCOUNTER — Encounter: Payer: Self-pay | Admitting: Family Medicine

## 2017-04-23 ENCOUNTER — Ambulatory Visit (INDEPENDENT_AMBULATORY_CARE_PROVIDER_SITE_OTHER): Payer: Medicare HMO | Admitting: Family Medicine

## 2017-04-23 VITALS — BP 150/90 | HR 89 | Temp 98.4°F | Resp 18 | Ht 67.25 in | Wt 177.2 lb

## 2017-04-23 DIAGNOSIS — Z1383 Encounter for screening for respiratory disorder NEC: Secondary | ICD-10-CM

## 2017-04-23 DIAGNOSIS — I10 Essential (primary) hypertension: Secondary | ICD-10-CM

## 2017-04-23 DIAGNOSIS — Z5181 Encounter for therapeutic drug level monitoring: Secondary | ICD-10-CM | POA: Diagnosis not present

## 2017-04-23 DIAGNOSIS — E785 Hyperlipidemia, unspecified: Secondary | ICD-10-CM | POA: Diagnosis not present

## 2017-04-23 DIAGNOSIS — Z136 Encounter for screening for cardiovascular disorders: Secondary | ICD-10-CM | POA: Diagnosis not present

## 2017-04-23 DIAGNOSIS — Z113 Encounter for screening for infections with a predominantly sexual mode of transmission: Secondary | ICD-10-CM | POA: Diagnosis not present

## 2017-04-23 DIAGNOSIS — Z6826 Body mass index (BMI) 26.0-26.9, adult: Secondary | ICD-10-CM | POA: Diagnosis not present

## 2017-04-23 DIAGNOSIS — Z1211 Encounter for screening for malignant neoplasm of colon: Secondary | ICD-10-CM

## 2017-04-23 DIAGNOSIS — Z125 Encounter for screening for malignant neoplasm of prostate: Secondary | ICD-10-CM | POA: Diagnosis not present

## 2017-04-23 DIAGNOSIS — Z1212 Encounter for screening for malignant neoplasm of rectum: Secondary | ICD-10-CM

## 2017-04-23 DIAGNOSIS — Z0000 Encounter for general adult medical examination without abnormal findings: Secondary | ICD-10-CM | POA: Diagnosis not present

## 2017-04-23 DIAGNOSIS — R69 Illness, unspecified: Secondary | ICD-10-CM | POA: Diagnosis not present

## 2017-04-23 DIAGNOSIS — Z1389 Encounter for screening for other disorder: Secondary | ICD-10-CM

## 2017-04-23 DIAGNOSIS — E119 Type 2 diabetes mellitus without complications: Secondary | ICD-10-CM

## 2017-04-23 DIAGNOSIS — Z23 Encounter for immunization: Secondary | ICD-10-CM

## 2017-04-23 LAB — POCT URINALYSIS DIP (MANUAL ENTRY)
Bilirubin, UA: NEGATIVE
GLUCOSE UA: NEGATIVE mg/dL
Ketones, POC UA: NEGATIVE mg/dL
Leukocytes, UA: NEGATIVE
NITRITE UA: NEGATIVE
Protein Ur, POC: NEGATIVE mg/dL
Spec Grav, UA: 1.015 (ref 1.010–1.025)
UROBILINOGEN UA: 0.2 U/dL
pH, UA: 7 (ref 5.0–8.0)

## 2017-04-23 LAB — POCT GLYCOSYLATED HEMOGLOBIN (HGB A1C): HEMOGLOBIN A1C: 6.5

## 2017-04-23 MED ORDER — LISINOPRIL-HYDROCHLOROTHIAZIDE 20-25 MG PO TABS: 1.0000 | ORAL_TABLET | Freq: Every day | ORAL | 1 refills | Status: DC

## 2017-04-23 NOTE — Patient Instructions (Addendum)
Bring your meter into our office so we can help you learn how to use it. Start an aspirin  daily. Double up on your fish oil.  Schedule your [place] VISIT with our Nurse Health Advisor [place].    IF you received an x-ray today, you will receive an invoice from [facility]. Please contact [facility] at [phone] with questions or concerns regarding your invoice.   IF you received labwork today, you will receive an invoice from [facility]. Please contact [facility] at [phone] with questions or concerns regarding your invoice.   Our billing staff will not be able to assist you with questions regarding bills from these companies.  You will be contacted with the lab results as soon as they are available. The fastest way to get your results is to activate your My Chart account. Instructions are located on the last page of this paperwork. If you have not heard from [place] regarding the results in 2 weeks, please contact this office.     Health Maintenance, Male A healthy lifestyle and preventive care is important for your health and wellness. Ask your health care provider about what schedule of regular examinations is right for you. What should I know about weight and diet? Eat a Healthy Diet  Eat plenty of vegetables, fruits, whole grains, low-fat dairy products, and lean protein.  Do not eat a lot of foods high in solid fats, added sugars, or salt.  Maintain a Healthy Weight Regular exercise can help you achieve or maintain a healthy weight. You should:  Do at least 150 minutes of exercise each week. The exercise should increase your heart rate and make you sweat (moderate-intensity exercise).  Do strength-training exercises at least twice a week.  Watch Your Levels of Cholesterol and Blood Lipids  Have your blood tested for lipids and cholesterol every 5 years starting at 67 years of age. If you are at high risk for heart disease, you should  start having your blood tested when you are 67 years old. You may need to have your cholesterol levels checked more often if: ? Your lipid or cholesterol levels are high. ? You are older than 67 years of age. ? You are at high risk for heart disease.  What should I know about cancer screening? Many types of cancers can be detected early and may often be prevented. Lung Cancer  You should be screened every year for lung cancer if: ? You are a current smoker who has smoked for at least 30 years. ? You are a former smoker who has quit within the past 15 years.  Talk to your health care provider about your screening options, when you should start screening, and how often you should be screened.  Colorectal Cancer  Routine colorectal cancer screening usually begins at 67 years of age and should be repeated every 5-10 years until you are 67 years old. You may need to be screened more often if early forms of precancerous polyps or small growths are found. Your health care provider may recommend screening at an earlier age if you have risk factors for colon cancer.  Your health care provider may recommend using home test kits to check for hidden blood in the stool.  A small camera at the end of a tube can be used to examine your colon (sigmoidoscopy or colonoscopy). This checks for the earliest forms of colorectal cancer.  Prostate and Testicular Cancer  Depending on your age and overall health, your health  care provider may do certain tests to screen for prostate and testicular cancer.  Talk to your health care provider about any symptoms or concerns you have about testicular or prostate cancer.  Skin Cancer  Check your skin from head to toe regularly.  Tell your health care provider about any new moles or changes in moles, especially if: ? There is a change in a mole's size, shape, or color. ? You have a mole that is larger than a pencil eraser.  Always use sunscreen. Apply sunscreen  liberally and repeat throughout the day.  Protect yourself by wearing long sleeves, pants, a wide-brimmed hat, and sunglasses when outside.  What should I know about heart disease, diabetes, and high blood pressure?  If you are 34-68 years of age, have your blood pressure checked every 3-5 years. If you are 59 years of age or older, have your blood pressure checked every year. You should have your blood pressure measured twice-once when you are at a hospital or clinic, and once when you are not at a hospital or clinic. Record the average of the two measurements. To check your blood pressure when you are not at a hospital or clinic, you can use: ? An automated blood pressure machine at a pharmacy. ? A home blood pressure monitor.  Talk to your health care provider about your target blood pressure.  If you are between 58-85 years old, ask your health care provider if you should take aspirin to prevent heart disease.  Have regular diabetes screenings by checking your fasting blood sugar level. ? If you are at a normal weight and have a low risk for diabetes, have this test once every three years after the age of 54. ? If you are overweight and have a high risk for diabetes, consider being tested at a younger age or more often.  A one-time screening for abdominal aortic aneurysm (AAA) by ultrasound is recommended for men aged 65-75 years who are current or former smokers. What should I know about preventing infection? Hepatitis B If you have a higher risk for hepatitis B, you should be screened for this virus. Talk with your health care provider to find out if you are at risk for hepatitis B infection. Hepatitis C Blood testing is recommended for:  Everyone born from [birth year] through 1966.  Anyone with known risk factors for hepatitis C.  Sexually Transmitted Diseases (STDs)  You should be screened each year for STDs including gonorrhea and chlamydia if: ? You are sexually active and are  younger than 67 years of age. ? You are older than 67 years of age and your health care provider tells you that you are at risk for this type of infection. ? Your sexual activity has changed since you were last screened and you are at an increased risk for chlamydia or gonorrhea. Ask your health care provider if you are at risk.  Talk with your health care provider about whether you are at high risk of being infected with HIV. Your health care provider may recommend a prescription medicine to help prevent HIV infection.  What else can I do?  Schedule regular health, dental, and eye exams.  Stay current with your vaccines (immunizations).  Do not use any tobacco products, such as cigarettes, chewing tobacco, and e-cigarettes. If you need help quitting, ask your health care provider.  Limit alcohol intake to no more than 2 drinks per day. One drink equals 12 ounces of beer, 5 ounces of wine,  or 1 ounces of hard liquor.  Do not use street drugs.  Do not share needles.  Ask your health care provider for help if you need support or information about quitting drugs.  Tell your health care provider if you often feel depressed.  Tell your health care provider if you have ever been abused or do not feel safe at home. This information is not intended to replace advice given to you by your health care provider. Make sure you discuss any questions you have with your health care provider. Document Released: 01/06/2008 Document Revised: 03/08/2016 Document Reviewed: 04/13/2015 Elsevier Interactive Patient Education  2018 [employer].    Piriformis Syndrome Rehab Ask your health care provider which exercises are safe for you. Do exercises exactly as told by your health care provider and adjust them as directed. It is normal to feel mild stretching, pulling, tightness, or discomfort as you do these exercises, but you should stop right away if you feel sudden pain or your pain gets worse.Do not  begin these exercises until told by your health care provider. Stretching and range of motion exercises These exercises warm up your muscles and joints and improve the movement and flexibility of your hip and pelvis. These exercises also help to relieve pain, numbness, and tingling. Exercise A: Hip rotators  1. Lie on your back on a firm surface. 2. Pull your left / right knee toward your same shoulder with your left / right hand until your knee is pointing toward the ceiling. Hold your left / right ankle with your other hand. 3. Keeping your knee steady, gently pull your left / right ankle toward your other shoulder until you feel a stretch in your buttocks. 4. Hold this position for __________ seconds. Repeat __________ times. Complete this stretch __________ times a day. Exercise B: Hip extensors 1. Lie on your back on a firm surface. Both of your legs should be straight. 2. Pull your left / right knee to your chest. Hold your leg in this position by holding onto the back of your thigh or the front of your knee. 3. Hold this position for __________ seconds. 4. Slowly return to the starting position. Repeat __________ times. Complete this stretch __________ times a day. Strengthening exercises These exercises build strength and endurance in your hip and thigh muscles. Endurance is the ability to use your muscles for a long time, even after they get tired. Exercise C: Straight leg raises ( hip abductors) 1. Lie on your side with your left / right leg in the top position. Lie so your head, shoulder, knee, and hip line up. Bend your bottom knee to help you balance. 2. Lift your top leg up 4-6 inches (10-15 cm), keeping your toes pointed straight ahead. 3. Hold this position for __________ seconds. 4. Slowly lower your leg to the starting position. Let your muscles relax completely. Repeat __________ times. Complete this exercise__________ times a day. Exercise D: Hip abductors and rotators,  quadruped  1. Get on your hands and knees on a firm, lightly padded surface. Your hands should be directly below your shoulders, and your knees should be directly below your hips. 2. Lift your left / right knee out to the side. Keep your knee bent. Do not twist your body. 3. Hold this position for __________ seconds. 4. Slowly lower your leg. Repeat __________ times. Complete this exercise__________ times a day. Exercise E: Straight leg raises ( hip extensors) 1. Lie on your abdomen on a bed or  a firm surface with a pillow under your hips. 2. Squeeze your buttock muscles and lift your left / right thigh off the bed. Do not let your back arch. 3. Hold this position for __________ seconds. 4. Slowly return to the starting position. Let your muscles relax completely before doing another repetition. Repeat __________ times. Complete this exercise__________ times a day. This information is not intended to replace advice given to you by your health care provider. Make sure you discuss any questions you have with your health care provider. Document Released: 07/10/2005 Document Revised: 03/14/2016 Document Reviewed: 06/22/2015 Elsevier Interactive Patient Education  2018 [employer].  Spondylolysis Rehab Ask your health care provider which exercises are safe for you. Do exercises exactly as told by your health care provider and adjust them as directed. It is normal to feel mild stretching, pulling, tightness, or discomfort as you do these exercises, but you should stop right away if you feel sudden pain or your pain gets worse. Do not begin these exercises until told by your health care provider. Stretching and range of motion exercises These exercises warm up your muscles and joints and improve the movement and flexibility of your hips and your back. These exercises may also help to relieve pain, numbness, and tingling. Exercise A: Single knee to chest  1. Lie on your back on a firm surface with  both legs straight. 2. Bend one of your knees. Use your hands to move your knee up toward your chest until you feel a gentle stretch in your lower back and buttock. ? Hold your leg in this position by holding onto the front of your knee. ? Keep your other leg as straight as possible. 3. Hold for __________ seconds. 4. Slowly return to the starting position. 5. Repeat this exercise with your other leg. Repeat __________ times. Complete this exercise __________ times a day. Exercise B: Hamstring stretch, supine  1. Lie on your back. 2. Hold both ends of a belt or towel as you loop it over the ball of one of your feet. The ball of your foot is on the walking surface, right under your toes. 3. Straighten your knee and slowly pull on the belt to raise your leg. ? Do not let your knee bend while you do this. ? Keep your other leg flat on the floor. ? Raise the leg until you feel a gentle stretch in the back of your knee or thigh. 4. Hold for __________ seconds. 5. If told by your health care provider, repeat this exercise with your other leg. Repeat __________ times. Complete this exercise __________ times a day. Strengthening exercises These exercises build strength and endurance in your back. Endurance is the ability to use your muscles for a long time, even after they get tired. Exercise C: Pelvic tilt 1. Lie on your back on a firm bed or the floor. Bend your knees and keep your feet flat. 2. Tense your abdominal muscles. Tip your pelvis up toward the ceiling and flatten your lower back into the floor. ? To help with this exercise, you may place a small towel under your lower back and try to push your back into the towel. 3. Hold for __________ seconds. 4. Let your muscles relax completely before you repeat this exercise. Repeat __________ times. Complete this exercise __________ times a day. Exercise D: Abdominal crunch  1. Lie on your back on a firm surface. Bend your knees and keep your  feet flat. Cross your arms over  your chest. 2. Tuck your chin down toward your chest, without bending your neck. 3. Use your abdominal muscles to lift your upper body off of the ground, straight up into the air. ? Try to lift yourself until your shoulder blades are off the ground. You may need to work up to this. ? Keep your lower back on the ground while you crunch upward. ? Do not hold your breath. 4. Slowly lower yourself down. Keep your abdominal muscles tense until you are back to the starting position. Repeat __________ times. Complete this exercise __________ times a day. Exercise E: Alternating arm and leg raises  1. Get on your hands and knees on a firm surface. If you are on a hard floor, you may want to use padding to cushion your knees, such as an exercise mat. 2. Line up your arms and legs. Your hands should be below your shoulders, and your knees should be below your hips. 3. Lift your left leg behind you. At the same time, raise your right arm and straighten it in front of you. ? Do not lift your leg higher than your hip. ? Do not lift your arm higher than your shoulder. ? Keep your abdominal and back muscles tight. ? Keep your hips facing the ground. ? Do not arch your back. ? Keep your balance carefully, and do not hold your breath. 4. Hold for __________ seconds. 5. Slowly return to the starting position and repeat with your right leg and your left arm. Repeat __________ times. Complete this exercise __________ times a day. Posture and body mechanics Body mechanics refers to the movements and positions of your body while you do your daily activities. Posture is part of body mechanics. Good posture and healthy body mechanics can help to relieve stress in your body's tissues and joints. Good posture means that your spine is in its natural S-curve position (your spine is neutral), your shoulders are pulled back slightly, and your head is not tipped forward. The following are  general guidelines for applying improved posture and body mechanics to your everyday activities. Standing   When standing, keep your spine neutral and your feet about hip-width apart. Keep a slight bend in your knees. Your ears, shoulders, and hips should line up with each other.  When you do a task in which you stand in one place for a long time, place one foot up on a stable object that is 2-4 inches (5-10 cm) high, such as a footstool. This helps keep your spine neutral. Sitting   When sitting, keep your spine neutral and keep your feet flat on the floor. Use a footrest, if necessary, and keep your thighs parallel to the floor. Avoid rounding your shoulders, and avoid tilting your head forward.  When working at a desk or a computer, keep your desk at a height where your hands are slightly lower than your elbows. Slide your chair under your desk so you are close enough to maintain good posture.  When working at a computer, place your monitor at a height where you are looking straight ahead and you do not have to tilt your head forward or downward to look at the screen. Resting  When lying down and resting, avoid positions that are most painful for you.  If you have pain with activities such as sitting, bending, stooping, or squatting (flexion-based activities), lie in a position in which your body does not bend very much. For example, avoid curling up on your side  with your arms and knees near your chest (fetal position).  If you have pain with activities such as standing for a long time or reaching with your arms (extension-based activities), lie with your spine in a neutral position and bend your knees slightly. Try the following positions: ? Lying on your side with a pillow between your knees. ? Lying on your back with a pillow under your knees.  Lifting   When lifting objects, keep your feet at least shoulder-width apart and tighten your abdominal muscles.  Bend your knees and  hips and keep your spine neutral. It is important to lift using the strength of your legs, not your back. Do not lock your knees straight out.  Always ask for help to lift heavy or awkward objects. This information is not intended to replace advice given to you by your health care provider. Make sure you discuss any questions you have with your health care provider. Document Released: 07/10/2005 Document Revised: 03/16/2016 Document Reviewed: 04/20/2015 Elsevier Interactive Patient Education  [employer].

## 2017-04-24 LAB — COMPREHENSIVE METABOLIC PANEL
A/G RATIO: 1.4 (ref 1.2–2.2)
ALK PHOS: 61 IU/L (ref 39–117)
ALT: 17 IU/L (ref 0–44)
AST: 25 IU/L (ref 0–40)
Albumin: 4.6 g/dL (ref 3.6–4.8)
BILIRUBIN TOTAL: 0.9 mg/dL (ref 0.0–1.2)
BUN/Creatinine Ratio: 15 (ref 10–24)
BUN: 18 mg/dL (ref 8–27)
CHLORIDE: 99 mmol/L (ref 96–106)
CO2: 27 mmol/L (ref 20–29)
Calcium: 10 mg/dL (ref 8.6–10.2)
Creatinine, Ser: 1.21 mg/dL (ref 0.76–1.27)
GFR calc Af Amer: 71 mL/min/{1.73_m2} (ref >59)
GFR calc non Af Amer: 62 mL/min/{1.73_m2} (ref >59)
GLUCOSE: 96 mg/dL (ref 65–99)
Globulin, Total: 3.2 g/dL (ref 1.5–4.5)
Potassium: 4.1 mmol/L (ref 3.5–5.2)
SODIUM: 139 mmol/L (ref 134–144)
TOTAL PROTEIN: 7.8 g/dL (ref 6.0–8.5)

## 2017-04-24 LAB — CBC WITH DIFFERENTIAL/PLATELET
BASOS ABS: 0 10*3/uL (ref 0.0–0.2)
Basos: 0 %
EOS (ABSOLUTE): 1.1 10*3/uL — AB (ref 0.0–0.4)
Eos: 10 %
Hematocrit: 46.7 % (ref 37.5–51.0)
Hemoglobin: 15.9 g/dL (ref 13.0–17.7)
IMMATURE GRANS (ABS): 0 10*3/uL (ref 0.0–0.1)
Immature Granulocytes: 0 %
LYMPHS: 26 %
Lymphocytes Absolute: 3 10*3/uL (ref 0.7–3.1)
MCH: 30.8 pg (ref 26.6–33.0)
MCHC: 34 g/dL (ref 31.5–35.7)
MCV: 90 fL (ref 79–97)
MONOS ABS: 0.9 10*3/uL (ref 0.1–0.9)
Monocytes: 8 %
NEUTROS ABS: 6.4 10*3/uL (ref 1.4–7.0)
NEUTROS PCT: 56 %
PLATELETS: 205 10*3/uL (ref 150–379)
RBC: 5.17 x10E6/uL (ref 4.14–5.80)
RDW: 14.5 % (ref 12.3–15.4)
WBC: 11.5 10*3/uL — ABNORMAL HIGH (ref 3.4–10.8)

## 2017-04-24 LAB — TSH: TSH: 1.41 u[IU]/mL (ref 0.450–4.500)

## 2017-04-24 LAB — HCV AB W/RFLX TO VERIFICATION

## 2017-04-24 LAB — PSA: PROSTATE SPECIFIC AG, SERUM: 1.8 ng/mL (ref 0.0–4.0)

## 2017-04-24 LAB — HCV INTERPRETATION

## 2017-05-01 ENCOUNTER — Ambulatory Visit (INDEPENDENT_AMBULATORY_CARE_PROVIDER_SITE_OTHER): Payer: Medicare HMO

## 2017-05-01 VITALS — BP 130/80 | HR 92 | Ht 67.0 in | Wt 174.4 lb

## 2017-05-01 DIAGNOSIS — Z0000 Encounter for general adult medical examination without abnormal findings: Secondary | ICD-10-CM | POA: Diagnosis not present

## 2017-05-01 NOTE — Progress Notes (Signed)
Subjective:   Creed Kail is a 66 y.o. male who presents for an Initial Medicare Annual Wellness Visit.  Review of Systems  N/A Cardiac Risk Factors include: advanced age (>65mn, >>65women);male gender    Objective:    Today's Vitals   05/01/17 1113 05/01/17 1123  BP: 130/80   Pulse: 92   SpO2: 96%   Weight: 174 lb 6 oz (79.1 kg)   Height: '5\' 7"'  (1.702 m)   PainSc:  7    Body mass index is 27.31 kg/m.  Current Medications (verified) Outpatient Encounter Prescriptions as of 05/01/2017  Medication Sig  . blood glucose meter kit and supplies KIT Dispense based on patient and insurance preference. Use up to four times daily as directed. (FOR ICD-9 250.00, 250.01).include 100 strips and lancets  . lisinopril-hydrochlorothiazide (PRINZIDE,ZESTORETIC) 20-25 MG tablet Take 1 tablet by mouth daily.  . metFORMIN (GLUCOPHAGE) 500 MG tablet TAKE ONE TABLET BY MOUTH TWICE DAILY WITH A MEAL  . omega-3 fish oil (MAXEPA) 1000 MG CAPS capsule Take by mouth.  . rosuvastatin (CRESTOR) 40 MG tablet TAKE ONE TABLET BY MOUTH ONCE DAILY   No facility-administered encounter medications on file as of 05/01/2017.     Allergies (verified) Patient has no known allergies.   History: Past Medical History:  Diagnosis Date  . Diabetes mellitus without complication ([facility]    Past Surgical History:  Procedure Laterality Date  . SPINE SURGERY     History reviewed. No pertinent family history. Social History   Occupational History  . Not on file.   Social History Main Topics  . Smoking status: Never Smoker  . Smokeless tobacco: Never Used  . Alcohol use 0.0 oz/week     Comment: socially  . Drug use: No  . Sexual activity: Not on file   Tobacco Counseling Counseling given: Not Answered   Activities of Daily Living In your present state of health, do you have any difficulty performing the following activities: 05/01/2017 05/01/2017  Hearing? N N  Vision? N N  Difficulty  concentrating or making decisions? N -  Walking or climbing stairs? N -  Dressing or bathing? N -  Doing errands, shopping? N -  Preparing Food and eating ? N -  Using the Toilet? N -  In the past six months, have you accidently leaked urine? N -  Do you have problems with loss of bowel control? N -  Managing your Medications? N -  Managing your Finances? N -  Housekeeping or managing your Housekeeping? N -  Some recent data might be hidden    Immunizations and Health Maintenance Immunization History  Administered Date(s) Administered  . Influenza,inj,Quad PF,6+ Mos 07/03/2016, 04/23/2017  . Pneumococcal Conjugate-13 12/21/2016   There are no preventive care reminders to display for this patient.  Patient Care Team: SShawnee Knapp MD as PCP - General (Family Medicine)  Indicate any recent Medical Services you may have received from other than [facility] providers in the past year (date may be approximate).    Assessment:   This is a routine wellness examination for Radek.   Hearing/Vision screen Vision Screening Comments: Patient has not see his eye doctor since 2016. He plans to follow up soon.   Dietary issues and exercise activities discussed: Current Exercise Habits: Structured exercise class, Type of exercise: treadmill (exercise bike), Time (Minutes): 60, Frequency (Times/Week): 4, Weekly Exercise (Minutes/Week): 240, Intensity: Moderate, Exercise limited by: None identified  Goals    . Exercise to strengthen back  Patient wants to try to do more exercises that will help to strengthen his back and hopefully help resolve his back pain.       Depression Screen PHQ 2/9 Scores 05/01/2017 04/23/2017 12/21/2016 08/22/2016  PHQ - 2 Score 0 0 0 0    Fall Risk Fall Risk  05/01/2017 04/23/2017 12/21/2016 08/22/2016 01/11/2016  Falls in the past year? No No No No No    Cognitive Function:     6CIT Screen 05/01/2017  What Year? 0 points  What month? 0 points  What time? 0  points  Count back from 20 0 points  Months in reverse 4 points  Repeat phrase 0 points  Total Score 4    Screening Tests Health Maintenance  Topic Date Due  . OPHTHALMOLOGY EXAM  08/24/2017 (Originally 12/04/1960)  . COLONOSCOPY  05/01/2018 (Originally 12/04/1999)  . TETANUS/TDAP  05/01/2018 (Originally 12/03/1968)  . FOOT EXAM  08/22/2017  . HEMOGLOBIN A1C  10/22/2017  . PNA vac Low Risk Adult (2 of 2 - PPSV23) 12/21/2017  . INFLUENZA VACCINE  Completed  . Hepatitis C Screening  Completed        Plan:   I have personally reviewed and noted the following in the patient's chart:   . Medical and social history . Use of alcohol, tobacco or illicit drugs  . Current medications and supplements . Functional ability and status . Nutritional status . Physical activity . Advanced directives . List of other physicians . Hospitalizations, surgeries, and ER visits in previous 12 months . Vitals . Screenings to include cognitive, depression, and falls . Referrals and appointments  In addition, I have reviewed and discussed with patient certain preventive protocols, quality metrics, and best practice recommendations. A written personalized care plan for preventive services as well as general preventive health recommendations were provided to patient.  Patient declined colonoscopy, shingles and tetanus vaccines.    Andrez Grime, LPN   29/11/2839

## 2017-05-01 NOTE — Patient Instructions (Addendum)
Lee Lawson , Thank you for taking time to come for your Medicare Wellness Visit. I appreciate your ongoing commitment to your health goals. Please review the following plan we discussed and let me know if I can assist you in the future.   Screening recommendations/referrals: Colonoscopy: declined  Recommended yearly ophthalmology/optometry visit for glaucoma screening and checkup Recommended yearly dental visit for hygiene and checkup  Vaccinations: Influenza vaccine: up to date Pneumococcal vaccine: up to date, next due 12/21/2017 Tdap vaccine: declined due to insurance  Shingles vaccine: declined   Advanced directives: Advance directive discussed with you today. Even though you declined this today please call our office should you change your mind and we can give you the proper paperwork for you to fill out.   Conditions/risks identified: Try to do more exercises that will help to strengthen your back and hopefully help resolve your back pain.  Next appointment: schedule follow up visit with PCP, 1 year for AWV  Preventive Care 65 Years and Older, Male Preventive care refers to lifestyle choices and visits with your health care provider that can promote health and wellness. What does preventive care include?  A yearly physical exam. This is also called an annual well check.  Dental exams once or twice a year.  Routine eye exams. Ask your health care provider how often you should have your eyes checked.  Personal lifestyle choices, including:  Daily care of your teeth and gums.  Regular physical activity.  Eating a healthy diet.  Avoiding tobacco and drug use.  Limiting alcohol use.  Practicing safe sex.  Taking low doses of aspirin every day.  Taking vitamin and mineral supplements as recommended by your health care provider. What happens during an annual well check? The services and screenings done by your health care provider during your annual well check  will depend on your age, overall health, lifestyle risk factors, and family history of disease. Counseling  Your health care provider may ask you questions about your:  Alcohol use.  Tobacco use.  Drug use.  Emotional well-being.  Home and relationship well-being.  Sexual activity.  Eating habits.  History of falls.  Memory and ability to understand (cognition).  Work and work Astronomer. Screening  You may have the following tests or measurements:  Height, weight, and BMI.  Blood pressure.  Lipid and cholesterol levels. These may be checked every 5 years, or more frequently if you are over 66 years old.  Skin check.  Lung cancer screening. You may have this screening every year starting at age 66 if you have a 30-pack-year history of smoking and currently smoke or have quit within the past 15 years.  Fecal occult blood test (FOBT) of the stool. You may have this test every year starting at age 27.  Flexible sigmoidoscopy or colonoscopy. You may have a sigmoidoscopy every 5 years or a colonoscopy every 10 years starting at age 54.  Prostate cancer screening. Recommendations will vary depending on your family history and other risks.  Hepatitis C blood test.  Hepatitis B blood test.  Sexually transmitted disease (STD) testing.  Diabetes screening. This is done by checking your blood sugar (glucose) after you have not eaten for a while (fasting). You may have this done every 1-3 years.  Abdominal aortic aneurysm (AAA) screening. You may need this if you are a current or former smoker.  Osteoporosis. You may be screened starting at age 32 if you are at high risk. Talk with your health  care provider about your test results, treatment options, and if necessary, the need for more tests. Vaccines  Your health care provider may recommend certain vaccines, such as:  Influenza vaccine. This is recommended every year.  Tetanus, diphtheria, and acellular pertussis  (Tdap, Td) vaccine. You may need a Td booster every 10 years.  Zoster vaccine. You may need this after age 42.  Pneumococcal 13-valent conjugate (PCV13) vaccine. One dose is recommended after age 47.  Pneumococcal polysaccharide (PPSV23) vaccine. One dose is recommended after age 20. Talk to your health care provider about which screenings and vaccines you need and how often you need them. This information is not intended to replace advice given to you by your health care provider. Make sure you discuss any questions you have with your health care provider. Document Released: 08/06/2015 Document Revised: 03/29/2016 Document Reviewed: 05/11/2015 Elsevier Interactive Patient Education  2017 [place] Prevention in the Home Falls can cause injuries. They can happen to people of all ages. There are many things you can do to make your home safe and to help prevent falls. What can I do on the outside of my home?  Regularly fix the edges of walkways and driveways and fix any cracks.  Remove anything that might make you trip as you walk through a door, such as a raised step or threshold.  Trim any bushes or trees on the path to your home.  Use bright outdoor lighting.  Clear any walking paths of anything that might make someone trip, such as rocks or tools.  Regularly check to see if handrails are loose or broken. Make sure that both sides of any steps have handrails.  Any raised decks and porches should have guardrails on the edges.  Have any leaves, snow, or ice cleared regularly.  Use sand or salt on walking paths during winter.  Clean up any spills in your garage right away. This includes oil or grease spills. What can I do in the bathroom?  Use night lights.  Install grab bars by the toilet and in the tub and shower. Do not use towel bars as grab bars.  Use non-skid mats or decals in the tub or shower.  If you need to sit down in the shower, use a plastic, non-slip  stool.  Keep the floor dry. Clean up any water that spills on the floor as soon as it happens.  Remove soap buildup in the tub or shower regularly.  Attach bath mats securely with double-sided non-slip rug tape.  Do not have throw rugs and other things on the floor that can make you trip. What can I do in the bedroom?  Use night lights.  Make sure that you have a light by your bed that is easy to reach.  Do not use any sheets or blankets that are too big for your bed. They should not hang down onto the floor.  Have a firm chair that has side arms. You can use this for support while you get dressed.  Do not have throw rugs and other things on the floor that can make you trip. What can I do in the kitchen?  Clean up any spills right away.  Avoid walking on wet floors.  Keep items that you use a lot in easy-to-reach places.  If you need to reach something above you, use a strong step stool that has a grab bar.  Keep electrical cords out of the way.  Do not use floor  polish or wax that makes floors slippery. If you must use wax, use non-skid floor wax.  Do not have throw rugs and other things on the floor that can make you trip. What can I do with my stairs?  Do not leave any items on the stairs.  Make sure that there are handrails on both sides of the stairs and use them. Fix handrails that are broken or loose. Make sure that handrails are as long as the stairways.  Check any carpeting to make sure that it is firmly attached to the stairs. Fix any carpet that is loose or worn.  Avoid having throw rugs at the top or bottom of the stairs. If you do have throw rugs, attach them to the floor with carpet tape.  Make sure that you have a light switch at the top of the stairs and the bottom of the stairs. If you do not have them, ask someone to add them for you. What else can I do to help prevent falls?  Wear shoes that:  Do not have high heels.  Have rubber bottoms.  Are  comfortable and fit you well.  Are closed at the toe. Do not wear sandals.  If you use a stepladder:  Make sure that it is fully opened. Do not climb a closed stepladder.  Make sure that both sides of the stepladder are locked into place.  Ask someone to hold it for you, if possible.  Clearly mark and make sure that you can see:  Any grab bars or handrails.  First and last steps.  Where the edge of each step is.  Use tools that help you move around (mobility aids) if they are needed. These include:  Canes.  Walkers.  Scooters.  Crutches.  Turn on the lights when you go into a dark area. Replace any light bulbs as soon as they burn out.  Set up your furniture so you have a clear path. Avoid moving your furniture around.  If any of your floors are uneven, fix them.  If there are any pets around you, be aware of where they are.  Review your medicines with your doctor. Some medicines can make you feel dizzy. This can increase your chance of falling. Ask your doctor what other things that you can do to help prevent falls. This information is not intended to replace advice given to you by your health care provider. Make sure you discuss any questions you have with your health care provider. Document Released: 05/06/2009 Document Revised: 12/16/2015 Document Reviewed: 08/14/2014 Elsevier Interactive Patient Education  2017 [employer].

## 2017-07-09 ENCOUNTER — Other Ambulatory Visit: Payer: Self-pay | Admitting: Family Medicine

## 2017-09-19 ENCOUNTER — Other Ambulatory Visit: Payer: Self-pay

## 2017-09-19 ENCOUNTER — Ambulatory Visit (INDEPENDENT_AMBULATORY_CARE_PROVIDER_SITE_OTHER): Payer: Medicare HMO

## 2017-09-19 ENCOUNTER — Ambulatory Visit (INDEPENDENT_AMBULATORY_CARE_PROVIDER_SITE_OTHER): Payer: Medicare HMO | Admitting: Physician Assistant

## 2017-09-19 ENCOUNTER — Encounter: Payer: Self-pay | Admitting: Physician Assistant

## 2017-09-19 VITALS — BP 154/88 | HR 90 | Temp 98.1°F | Resp 16 | Ht 67.0 in | Wt 171.0 lb

## 2017-09-19 DIAGNOSIS — R1032 Left lower quadrant pain: Secondary | ICD-10-CM

## 2017-09-19 LAB — POCT CBC
GRANULOCYTE PERCENT: 56.5 % (ref 37–80)
HCT, POC: 50.2 % (ref 43.5–53.7)
Hemoglobin: 16.4 g/dL (ref 14.1–18.1)
Lymph, poc: 2.5 (ref 0.6–3.4)
MCH, POC: 29.7 pg (ref 27–31.2)
MCHC: 32.8 g/dL (ref 31.8–35.4)
MCV: 90.6 fL (ref 80–97)
MID (CBC): 1 — AB (ref 0–0.9)
MPV: 6.6 fL (ref 0–99.8)
POC GRANULOCYTE: 4.5 (ref 2–6.9)
POC LYMPH %: 31.2 % (ref 10–50)
POC MID %: 12.3 % — AB (ref 0–12)
Platelet Count, POC: 239 10*3/uL (ref 142–424)
RBC: 5.54 M/uL (ref 4.69–6.13)
RDW, POC: 13.4 %
WBC: 7.9 10*3/uL (ref 4.6–10.2)

## 2017-09-19 LAB — POCT URINALYSIS DIP (MANUAL ENTRY)
BILIRUBIN UA: NEGATIVE mg/dL
Bilirubin, UA: NEGATIVE
Blood, UA: NEGATIVE
GLUCOSE UA: NEGATIVE mg/dL
Leukocytes, UA: NEGATIVE
Nitrite, UA: NEGATIVE
Protein Ur, POC: NEGATIVE mg/dL
SPEC GRAV UA: 1.02 (ref 1.010–1.025)
UROBILINOGEN UA: 0.2 U/dL
pH, UA: 8.5 — AB (ref 5.0–8.0)

## 2017-09-19 MED ORDER — AMOXICILLIN-POT CLAVULANATE 875-125 MG PO TABS: 1.0000 | ORAL_TABLET | Freq: Two times a day (BID) | ORAL | 0 refills | Status: DC

## 2017-09-19 MED ORDER — AMOXICILLIN-POT CLAVULANATE 875-125 MG PO TABS: 1.0000 | ORAL_TABLET | Freq: Two times a day (BID) | ORAL | 0 refills | Status: AC

## 2017-09-19 NOTE — Addendum Note (Signed)
Addended by: Laurey ArrowGREER, JULIE M on: 09/19/2017 11:49 AM   Modules accepted: Orders

## 2017-09-19 NOTE — Patient Instructions (Addendum)
If you feel you are getting constipated drink three caps of colace  For constipation:  -Make sure you are drinking enough water daily. -Take 2-3 caps of colace daily until you are stooling normally.    Start the antibiotics as you have tenderness in your colon and we have no colonoscopy to prove you don't have diverticulosis.   I need a stool sample.    IF you received an x-ray today, you will receive an invoice from [facility]. Please contact [facility] at [phone] with questions or concerns regarding your invoice.   IF you received labwork today, you will receive an invoice from [facility]. Please contact [facility] at [phone] with questions or concerns regarding your invoice.   Our billing staff will not be able to assist you with questions regarding bills from these companies.  You will be contacted with the lab results as soon as they are available. The fastest way to get your results is to activate your My Chart account. Instructions are located on the last page of this paperwork. If you have not heard from us regarding the results in 2 weeks, please contact this office.

## 2017-09-19 NOTE — Progress Notes (Signed)
09/19/2017 11:13 AM   DOB: 1951/02/16 / MRN: [id number]  SUBJECTIVE:  Lee Lawson is a 67 y.o. male presenting for LLQ abdominal pain.  3 days.  Is moving his bowels.  Getting worse.  Has tried antacids with mild relief.  No colonoscopy exist in the system. No abx in the last 6 weeks. This started with eating at a buffet in which he had shell fish and several sauces that he is not used to eating.   He has No Known Allergies.   He  has a past medical history of Diabetes mellitus without complication (HCC).    He  reports that  has never smoked. he has never used smokeless tobacco. He reports that he drinks alcohol. He reports that he does not use drugs. He  has no sexual activity history on file. The patient  has a past surgical history that includes Spine surgery.  His family history is not on file.  Review of Systems  Constitutional: Negative for chills and fever.  Respiratory: Negative for cough.   Cardiovascular: Negative for chest pain.  Gastrointestinal: Positive for abdominal pain and diarrhea. Negative for blood in stool, constipation, heartburn, melena, nausea and vomiting.  Skin: Negative for rash.    The problem list and medications were reviewed and updated by myself where necessary and exist elsewhere in the encounter.   OBJECTIVE:  BP (!) 154/88 (BP Location: Right Arm, Patient Position: Sitting, Cuff Size: Large)   Pulse 90   Temp 98.1 F (36.7 C) (Oral)   Resp 16   Ht 5\' 7"  (1.702 m)   Wt 171 lb (77.6 kg)   SpO2 97%   BMI 26.78 kg/m   Pulse Readings from Last 3 Encounters:  09/19/17 90  05/01/17 92  04/23/17 89     Physical Exam  Constitutional: He is oriented to person, place, and time. He appears well-developed. He is active and cooperative.  Non-toxic appearance.  Cardiovascular: Normal rate, regular rhythm, S1 normal, S2 normal, normal heart sounds, intact distal pulses and normal pulses. Exam reveals no gallop and no friction rub.  No  murmur heard. Pulmonary/Chest: Effort normal. No stridor. No tachypnea. No respiratory distress. He has no wheezes. He has no rales. He exhibits no tenderness.  Abdominal: He exhibits no distension and no mass. There is tenderness (LLQ without rebound.  No gaurding. ). There is no rebound and no guarding.  Genitourinary: Rectum normal and prostate normal. Rectal exam shows guaiac negative stool.  Musculoskeletal: He exhibits no edema.  Neurological: He is alert and oriented to person, place, and time.  Skin: Skin is warm and dry. He is not diaphoretic. No pallor.  Vitals reviewed.   Results for orders placed or performed in visit on 09/19/17 (from the past 72 hour(s))  POCT CBC     Status: Abnormal   Collection Time: 09/19/17 10:58 AM  Result Value Ref Range   WBC 7.9 4.6 - 10.2 K/uL   Lymph, poc 2.5 0.6 - 3.4   POC LYMPH PERCENT 31.2 10 - 50 %L   MID (cbc) 1.0 (A) 0 - 0.9   POC MID % 12.3 (A) 0 - 12 %M   POC Granulocyte 4.5 2 - 6.9   Granulocyte percent 56.5 37 - 80 %G   RBC 5.54 4.69 - 6.13 M/uL   Hemoglobin 16.4 14.1 - 18.1 g/dL   HCT, POC 11.9 14.7 - 53.7 %   MCV 90.6 80 - 97 fL   MCH, POC 29.7 27 -  31.2 pg   MCHC 32.8 31.8 - 35.4 g/dL   RDW, POC 16.113.4 %   Platelet Count, POC 239 142 - 424 K/uL   MPV 6.6 0 - 99.8 fL  POCT urinalysis dipstick     Status: Abnormal   Collection Time: 09/19/17 11:12 AM  Result Value Ref Range   Color, UA yellow yellow   Clarity, UA clear clear   Glucose, UA negative negative mg/dL   Bilirubin, UA negative negative   Ketones, POC UA negative negative mg/dL   Spec Grav, UA 0.9601.020 [id number] - 1.025   Blood, UA negative negative   pH, UA 8.5 (A) 5.0 - 8.0   Protein Ur, POC negative negative mg/dL   Urobilinogen, UA 0.2 0.2 or 1.0 E.U./dL   Nitrite, UA Negative Negative   Leukocytes, UA Negative Negative    Dg Abd 2 Views  Result Date: 09/19/2017 CLINICAL DATA:  LEFT LOWER QUADRANT abdominal pain. EXAM: ABDOMEN - 2 VIEW COMPARISON:  None.  FINDINGS: Multiple air-fluid levels are identified, primarily within the colon. No evidence for small bowel obstruction. No evidence for organomegaly. No abnormal calcifications. Visualized osseous structures have a normal appearance. IMPRESSION: Air-fluid levels within the colon, consistent with diarrhea. No evidence for small bowel obstruction. Electronically Signed   By: Norva PavlovElizabeth  Brown M.D.   On: 09/19/2017 11:08    ASSESSMENT AND PLAN:  Gerarda Guntherhack was seen today for abdominal pain.  Diagnoses and all orders for this visit:  LLQ abdominal pain: TTP in the LLQ.  No occult blood on smear.  No colonoscopy and he has refused this in the past.  Augmentin for 7 days as I do not like his TTP and he is not constipated. RTC tomorrow or Friday if not improving.  ED if at any point he is worse, however, a perf is very unlikely given negative fecal occult blood.    -     POCT CBC -     POCT urinalysis dipstick -     DG Abd 2 Views; Future -     Allergens(96) Foods    The patient is advised to call or return to clinic if he does not see an improvement in symptoms, or to seek the care of the closest emergency department if he worsens with the above plan.   Deliah BostonMichael Kieara Schwark, MHS, PA-C [facility] 09/19/2017 11:13 AM

## 2017-09-20 LAB — GI PROFILE, STOOL, PCR
ASTROVIRUS: NOT DETECTED
Adenovirus F 40/41: NOT DETECTED
C DIFFICILE TOXIN A/B: NOT DETECTED
CAMPYLOBACTER: NOT DETECTED
Cryptosporidium: NOT DETECTED
Cyclospora cayetanensis: NOT DETECTED
E COLI O157: NOT DETECTED
ENTAMOEBA HISTOLYTICA: NOT DETECTED
Enteroaggregative E coli: NOT DETECTED
Enterotoxigenic E coli: NOT DETECTED
Giardia lamblia: NOT DETECTED
Norovirus GI/GII: NOT DETECTED
PLESIOMONAS SHIGELLOIDES: NOT DETECTED
ROTAVIRUS A: NOT DETECTED
SAPOVIRUS: NOT DETECTED
SHIGA-TOXIN-PRODUCING E COLI: DETECTED — AB
Salmonella: NOT DETECTED
Shigella/Enteroinvasive E coli: NOT DETECTED
VIBRIO CHOLERAE: NOT DETECTED
Vibrio: NOT DETECTED
Yersinia enterocolitica: NOT DETECTED

## 2017-09-21 LAB — ALLERGENS(96) FOODS
ALLERGEN COCONUT IGE: 0.12 kU/L — AB
ALLERGEN CORN, IGE: 0.91 kU/L — AB
Allergen Apple, IgE: 0.17 kU/L — AB
Allergen Banana IgE: 0.11 kU/L — AB
Allergen Barley IgE: 0.16 kU/L — AB
Allergen Cabbage IgE: 0.11 kU/L — AB
Allergen Carrot IgE: 0.21 kU/L — AB
Allergen Celery IgE: 0.16 kU/L — AB
Allergen Cinnamon IgE: <0.1 kU/L
Allergen Ginger IgE: <0.1 kU/L
Allergen Grapefruit IgE: <0.1 kU/L
Allergen Green Bean IgE: <0.1 kU/L
Allergen Green Pea IgE: <0.1 kU/L
Allergen Lettuce IgE: <0.1 kU/L
Allergen Lime IgE: <0.1 kU/L
Allergen Onion IgE: 0.11 kU/L — AB
Allergen Potato, White IgE: 0.14 kU/L — AB
Allergen Rice IgE: 0.15 kU/L — AB
Allergen Salmon IgE: <0.1 kU/L
Allergen Strawberry IgE: 0.28 kU/L — AB
Allergen Tomato, IgE: 0.26 kU/L — AB
Allergen Watermelon IgE: 0.15 kU/L — AB
Basil: <0.1 kU/L
Beef IgE: <0.1 kU/L
Clam IgE: 0.57 kU/L — AB
Codfish IgE: <0.1 kU/L
Coffee: <0.1 kU/L
Egg White IgE: <0.1 kU/L
F005-IGE RYE: 0.19 kU/L — AB
F007-IGE OAT: 0.17 kU/L — AB
F020-IgE Almond: 0.23 kU/L — AB
F023-IgE Crab: 1.85 kU/L — AB
F045-IgE Yeast: 0.16 kU/L — AB
F047-IGE GARLIC: 0.34 kU/L — AB
F078-IgE Casein: <0.1 kU/L
F080-IGE LOBSTER: 0.56 kU/L — AB
F087-IGE MELON: 0.48 kU/L — AB
F096-IgE Avocado: <0.1 kU/L
F202-IgE Cashew Nut: <0.1 kU/L
F208-IGE LEMON: 0.1 kU/L — AB
F214-IGE SPINACH: 0.18 kU/L — AB
F225-IGE PUMPKIN: 0.11 kU/L — AB
F242-IgE Bing Cherry: 0.31 kU/L — AB
F244-IGE CUCUMBER: 0.13 kU/L — AB
F261-IgE Asparagus: <0.1 kU/L
F262-IgE Eggplant: <0.1 kU/L
F265-IgE Cumin: <0.1 kU/L
F278-IgE Bayleaf (Laurel): <0.1 kU/L
F324-IGE HOP (FOOD): 0.11 kU/L — AB
F342-IgE Olive, Black: <0.1 kU/L
IgE Egg (Yolk): <0.1 kU/L
Lima Bean IgE: 0.15 kU/L — AB
Malt: 0.15 kU/L — AB
Orange: <0.1 kU/L
Pineapple IgE: 0.14 kU/L — AB
Pork IgE: <0.1 kU/L
SCALLOP IGE: 0.29 kU/L — AB
SESAME SEED IGE: 0.32 kU/L — AB
SHRIMP IGE: 2.76 kU/L — AB
Soybean IgE: <0.1 kU/L
Tuna: <0.1 kU/L
Vanilla: <0.1 kU/L
WHEAT IGE: 0.18 kU/L — AB
Whey: <0.1 kU/L
White Bean IgE: 0.12 kU/L — AB

## 2017-09-22 NOTE — Progress Notes (Signed)
Please send results in a letter.  I have advised him to stop taking the antibiotics. Please send him a letter of his allergies.  I have advised that he avoid shrimp and shellfish.  He will study the remainder of the results as he has low reactivity to most of the other foods outlines. Lee BostonMichael Cainen Burnham, MS, PA-C 1:29 PM, 09/22/2017

## 2017-10-15 ENCOUNTER — Other Ambulatory Visit: Payer: Self-pay | Admitting: Family Medicine

## 2017-10-16 ENCOUNTER — Other Ambulatory Visit: Payer: Self-pay | Admitting: Family Medicine

## 2017-12-05 DIAGNOSIS — Z7984 Long term (current) use of oral hypoglycemic drugs: Secondary | ICD-10-CM | POA: Diagnosis not present

## 2017-12-05 DIAGNOSIS — Z791 Long term (current) use of non-steroidal anti-inflammatories (NSAID): Secondary | ICD-10-CM | POA: Diagnosis not present

## 2017-12-05 DIAGNOSIS — E785 Hyperlipidemia, unspecified: Secondary | ICD-10-CM | POA: Diagnosis not present

## 2017-12-05 DIAGNOSIS — I10 Essential (primary) hypertension: Secondary | ICD-10-CM | POA: Diagnosis not present

## 2017-12-05 DIAGNOSIS — G8929 Other chronic pain: Secondary | ICD-10-CM | POA: Diagnosis not present

## 2017-12-05 DIAGNOSIS — R7303 Prediabetes: Secondary | ICD-10-CM | POA: Diagnosis not present

## 2017-12-05 DIAGNOSIS — M792 Neuralgia and neuritis, unspecified: Secondary | ICD-10-CM | POA: Diagnosis not present

## 2017-12-10 DIAGNOSIS — M5136 Other intervertebral disc degeneration, lumbar region: Secondary | ICD-10-CM | POA: Diagnosis not present

## 2017-12-10 DIAGNOSIS — M545 Low back pain: Secondary | ICD-10-CM | POA: Diagnosis not present

## 2018-01-03 ENCOUNTER — Other Ambulatory Visit: Payer: Self-pay | Admitting: Family Medicine

## 2018-04-02 ENCOUNTER — Other Ambulatory Visit: Payer: Self-pay | Admitting: Family Medicine

## 2018-04-02 NOTE — Telephone Encounter (Signed)
Attempted to contact pr regarding prescription refill request; last office visit 04/23/17; no upcoming visits noted; when pt calls back please schedule appointment; will refill prescription of 30 days per protocol.

## 2019-02-20 IMAGING — DX DG ABDOMEN 2V
3 series · 3 of 3 positions shown · non-contrast
Comparison: None.

CLINICAL DATA: LEFT LOWER QUADRANT abdominal pain.

EXAM:
ABDOMEN - 2 VIEW

[abdomen erect]
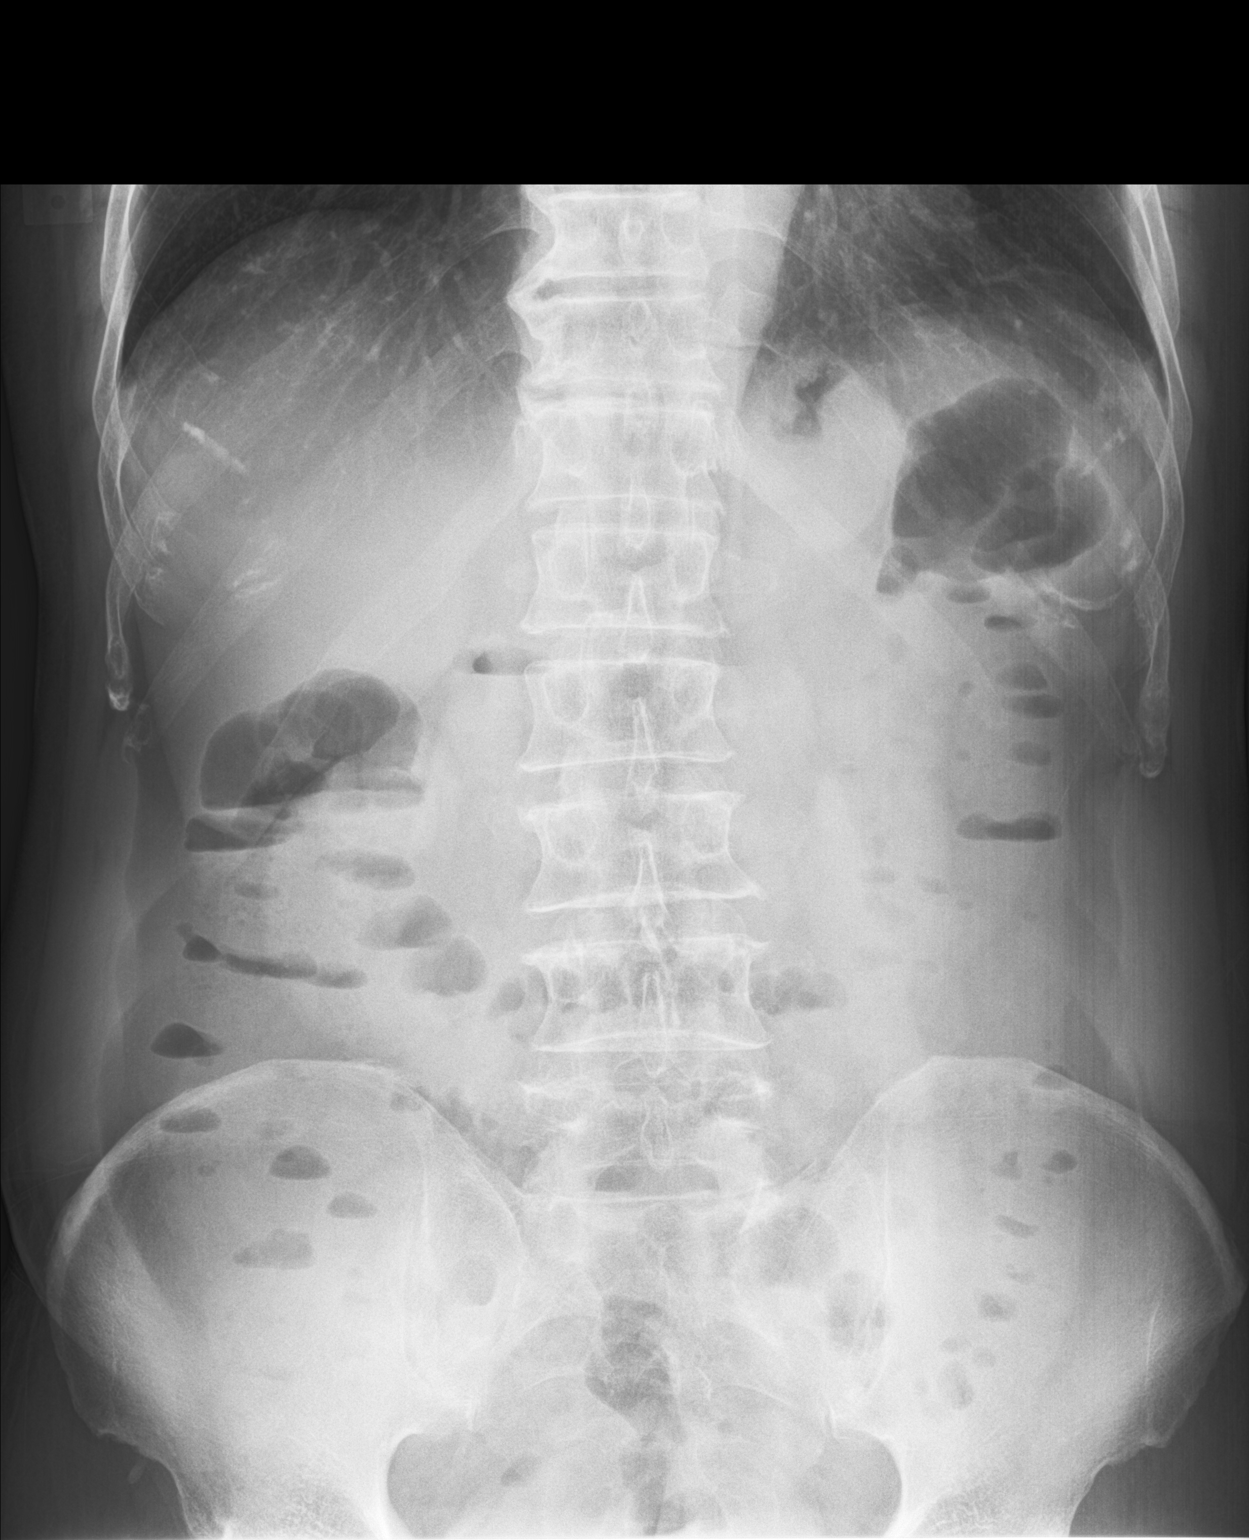

[abdomen supine (1 of 2)]
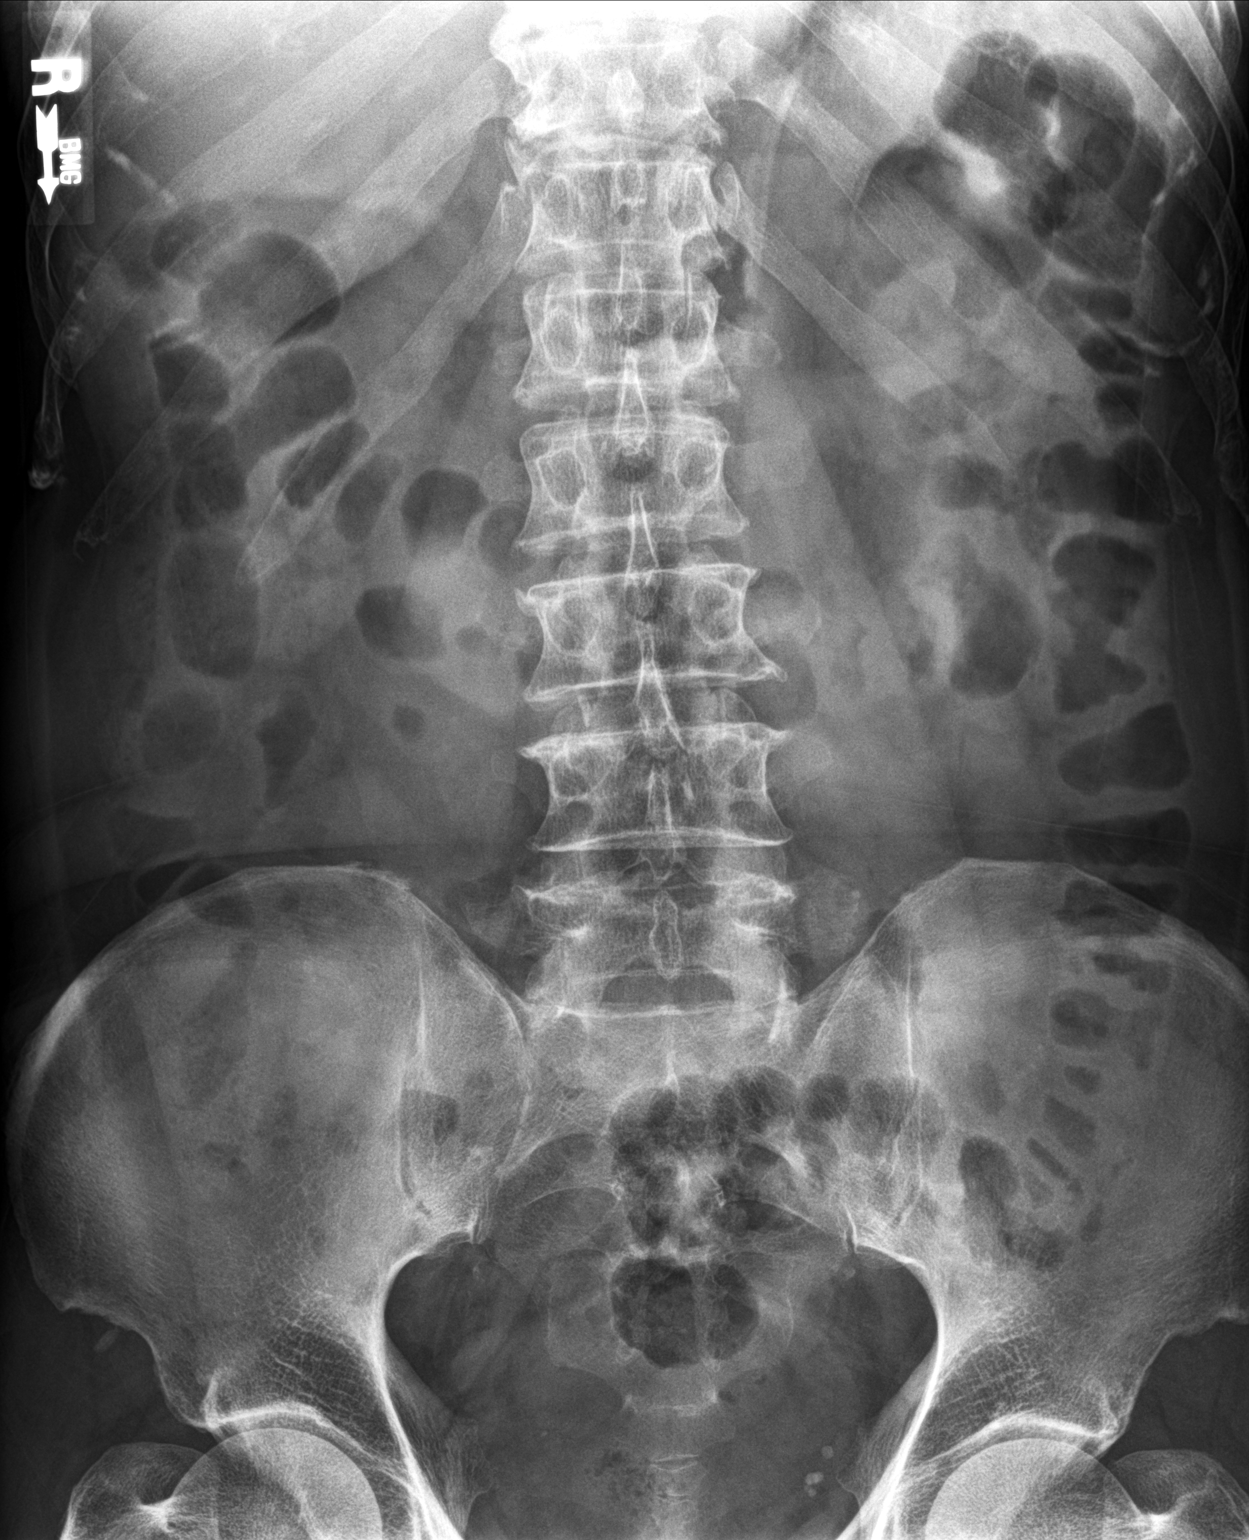

[abdomen supine (2 of 2)]
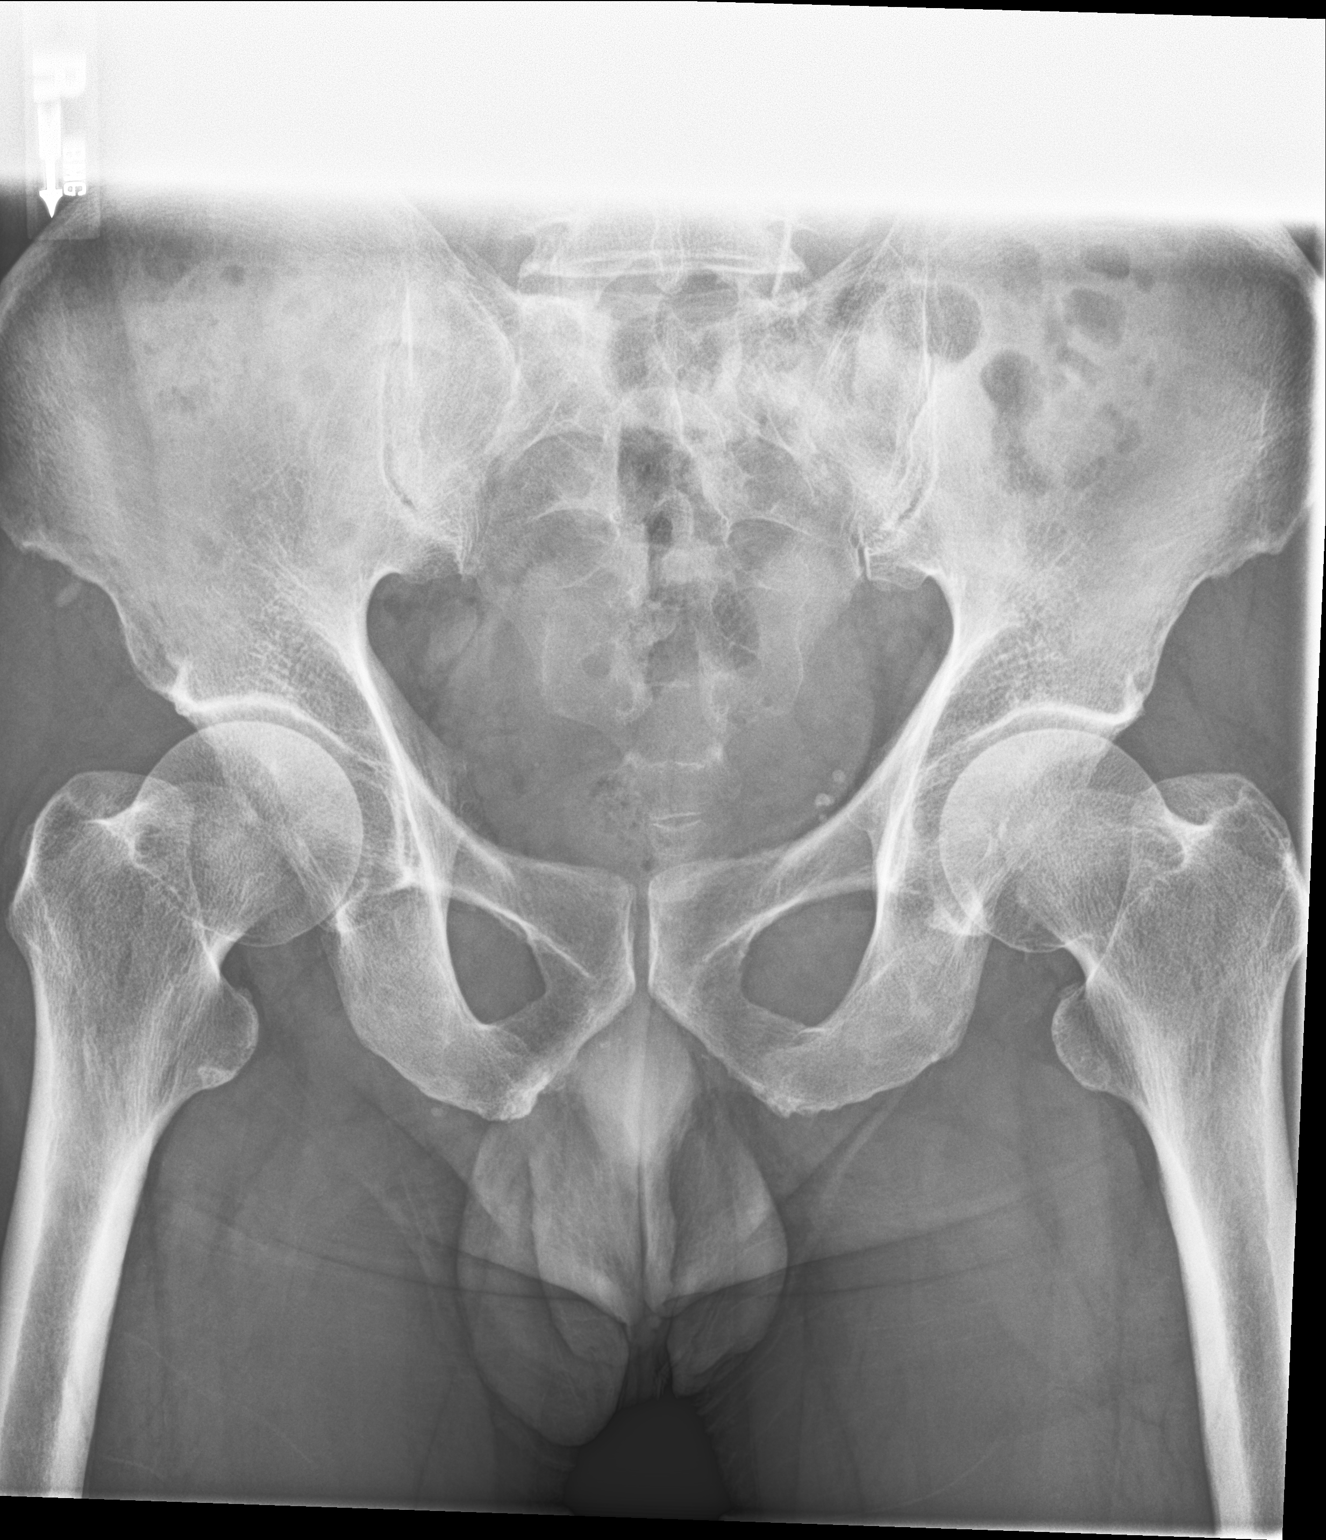

[3 of 3 positions shown; findings below may reference images not displayed]

FINDINGS: Multiple air-fluid levels are identified, primarily within the
colon. No evidence for small bowel obstruction. No evidence for
organomegaly. No abnormal calcifications. Visualized osseous
structures have a normal appearance.
IMPRESSION: Air-fluid levels within the colon, consistent with diarrhea. No
evidence for small bowel obstruction.
